# Patient Record
Sex: Female | Born: 1955 | Race: White | Hispanic: No | Smoking: Never smoker
Health system: Southern US, Community
[De-identification: ages and names within clinical notes are randomized; demographics above are authoritative.]

## PROBLEM LIST (undated history)

## (undated) DIAGNOSIS — I10 Essential (primary) hypertension: Secondary | ICD-10-CM

## (undated) DIAGNOSIS — E785 Hyperlipidemia, unspecified: Secondary | ICD-10-CM

## (undated) HISTORY — PX: CHOLECYSTECTOMY: SHX55

---

## 1997-06-25 ENCOUNTER — Ambulatory Visit (HOSPITAL_COMMUNITY): Admission: RE | Admit: 1997-06-25 | Discharge: 1997-06-25 | Payer: Self-pay | Admitting: *Deleted

## 1998-01-08 ENCOUNTER — Ambulatory Visit (HOSPITAL_COMMUNITY): Admission: RE | Admit: 1998-01-08 | Discharge: 1998-01-08 | Payer: Self-pay | Admitting: *Deleted

## 1998-01-27 ENCOUNTER — Ambulatory Visit (HOSPITAL_COMMUNITY): Admission: RE | Admit: 1998-01-27 | Discharge: 1998-01-27 | Payer: Self-pay | Admitting: *Deleted

## 1998-05-09 ENCOUNTER — Inpatient Hospital Stay (HOSPITAL_COMMUNITY): Admission: RE | Admit: 1998-05-09 | Discharge: 1998-05-10 | Payer: Self-pay | Admitting: Obstetrics and Gynecology

## 1999-02-27 ENCOUNTER — Encounter: Payer: Self-pay | Admitting: *Deleted

## 1999-02-27 ENCOUNTER — Ambulatory Visit (HOSPITAL_COMMUNITY): Admission: RE | Admit: 1999-02-27 | Discharge: 1999-02-27 | Payer: Self-pay | Admitting: *Deleted

## 2000-09-08 ENCOUNTER — Ambulatory Visit (HOSPITAL_COMMUNITY): Admission: RE | Admit: 2000-09-08 | Discharge: 2000-09-08 | Payer: Self-pay | Admitting: Obstetrics and Gynecology

## 2000-09-08 ENCOUNTER — Encounter: Payer: Self-pay | Admitting: Obstetrics and Gynecology

## 2001-09-12 ENCOUNTER — Encounter: Payer: Self-pay | Admitting: Obstetrics and Gynecology

## 2001-09-12 ENCOUNTER — Ambulatory Visit (HOSPITAL_COMMUNITY): Admission: RE | Admit: 2001-09-12 | Discharge: 2001-09-12 | Payer: Self-pay | Admitting: Obstetrics and Gynecology

## 2001-10-02 ENCOUNTER — Other Ambulatory Visit: Admission: RE | Admit: 2001-10-02 | Discharge: 2001-10-02 | Payer: Self-pay | Admitting: Obstetrics and Gynecology

## 2002-09-25 ENCOUNTER — Ambulatory Visit (HOSPITAL_COMMUNITY): Admission: RE | Admit: 2002-09-25 | Discharge: 2002-09-25 | Payer: Self-pay | Admitting: Obstetrics and Gynecology

## 2002-09-25 ENCOUNTER — Encounter: Payer: Self-pay | Admitting: Obstetrics and Gynecology

## 2002-10-30 ENCOUNTER — Other Ambulatory Visit: Admission: RE | Admit: 2002-10-30 | Discharge: 2002-10-30 | Payer: Self-pay | Admitting: Obstetrics and Gynecology

## 2003-11-12 ENCOUNTER — Ambulatory Visit (HOSPITAL_COMMUNITY): Admission: RE | Admit: 2003-11-12 | Discharge: 2003-11-12 | Payer: Self-pay | Admitting: Obstetrics and Gynecology

## 2003-11-12 ENCOUNTER — Other Ambulatory Visit: Admission: RE | Admit: 2003-11-12 | Discharge: 2003-11-12 | Payer: Self-pay | Admitting: Obstetrics and Gynecology

## 2004-11-17 ENCOUNTER — Ambulatory Visit (HOSPITAL_COMMUNITY): Admission: RE | Admit: 2004-11-17 | Discharge: 2004-11-17 | Payer: Self-pay | Admitting: Obstetrics and Gynecology

## 2005-12-28 ENCOUNTER — Ambulatory Visit (HOSPITAL_COMMUNITY): Admission: RE | Admit: 2005-12-28 | Discharge: 2005-12-28 | Payer: Self-pay | Admitting: Obstetrics and Gynecology

## 2007-01-10 ENCOUNTER — Ambulatory Visit (HOSPITAL_COMMUNITY): Admission: RE | Admit: 2007-01-10 | Discharge: 2007-01-10 | Payer: Self-pay | Admitting: Obstetrics and Gynecology

## 2008-01-18 ENCOUNTER — Ambulatory Visit (HOSPITAL_COMMUNITY): Admission: RE | Admit: 2008-01-18 | Discharge: 2008-01-18 | Payer: Self-pay | Admitting: Obstetrics and Gynecology

## 2009-02-25 ENCOUNTER — Ambulatory Visit (HOSPITAL_COMMUNITY): Admission: RE | Admit: 2009-02-25 | Discharge: 2009-02-25 | Payer: Self-pay | Admitting: Obstetrics and Gynecology

## 2010-03-05 ENCOUNTER — Ambulatory Visit (HOSPITAL_COMMUNITY): Admission: RE | Admit: 2010-03-05 | Discharge: 2010-03-05 | Payer: Self-pay | Admitting: Obstetrics and Gynecology

## 2011-02-10 ENCOUNTER — Other Ambulatory Visit (HOSPITAL_COMMUNITY): Payer: Self-pay | Admitting: Obstetrics and Gynecology

## 2011-02-10 DIAGNOSIS — Z1231 Encounter for screening mammogram for malignant neoplasm of breast: Secondary | ICD-10-CM

## 2011-03-09 ENCOUNTER — Ambulatory Visit (HOSPITAL_COMMUNITY)
Admission: RE | Admit: 2011-03-09 | Discharge: 2011-03-09 | Disposition: A | Discharge status: Home / Self Care | Payer: Managed Care, Other (non HMO) | Source: Ambulatory Visit | Attending: Obstetrics and Gynecology | Admitting: Obstetrics and Gynecology

## 2011-03-09 DIAGNOSIS — Z1231 Encounter for screening mammogram for malignant neoplasm of breast: Secondary | ICD-10-CM | POA: Insufficient documentation

## 2012-03-28 ENCOUNTER — Other Ambulatory Visit (HOSPITAL_COMMUNITY): Payer: Self-pay | Admitting: Obstetrics and Gynecology

## 2012-03-28 DIAGNOSIS — Z1231 Encounter for screening mammogram for malignant neoplasm of breast: Secondary | ICD-10-CM

## 2012-04-13 ENCOUNTER — Ambulatory Visit (HOSPITAL_COMMUNITY)
Admission: RE | Admit: 2012-04-13 | Discharge: 2012-04-13 | Disposition: A | Discharge status: Home / Self Care | Payer: Managed Care, Other (non HMO) | Source: Ambulatory Visit | Attending: Obstetrics and Gynecology | Admitting: Obstetrics and Gynecology

## 2012-04-13 DIAGNOSIS — Z1231 Encounter for screening mammogram for malignant neoplasm of breast: Secondary | ICD-10-CM | POA: Insufficient documentation

## 2013-04-05 ENCOUNTER — Other Ambulatory Visit (HOSPITAL_COMMUNITY): Payer: Self-pay | Admitting: Obstetrics and Gynecology

## 2013-04-05 DIAGNOSIS — Z1231 Encounter for screening mammogram for malignant neoplasm of breast: Secondary | ICD-10-CM

## 2013-04-17 ENCOUNTER — Ambulatory Visit (HOSPITAL_COMMUNITY): Payer: Managed Care, Other (non HMO)

## 2013-04-26 ENCOUNTER — Ambulatory Visit (HOSPITAL_COMMUNITY)
Admission: RE | Admit: 2013-04-26 | Discharge: 2013-04-26 | Disposition: A | Discharge status: Home / Self Care | Payer: Managed Care, Other (non HMO) | Source: Ambulatory Visit | Attending: Obstetrics and Gynecology | Admitting: Obstetrics and Gynecology

## 2013-04-26 DIAGNOSIS — Z1231 Encounter for screening mammogram for malignant neoplasm of breast: Secondary | ICD-10-CM | POA: Insufficient documentation

## 2014-04-22 ENCOUNTER — Other Ambulatory Visit (HOSPITAL_COMMUNITY): Payer: Self-pay | Admitting: Obstetrics and Gynecology

## 2014-04-22 DIAGNOSIS — Z1231 Encounter for screening mammogram for malignant neoplasm of breast: Secondary | ICD-10-CM

## 2014-05-06 ENCOUNTER — Ambulatory Visit (HOSPITAL_COMMUNITY)
Admission: RE | Admit: 2014-05-06 | Discharge: 2014-05-06 | Disposition: A | Discharge status: Home / Self Care | Payer: Managed Care, Other (non HMO) | Source: Ambulatory Visit | Attending: Obstetrics and Gynecology | Admitting: Obstetrics and Gynecology

## 2014-05-06 DIAGNOSIS — Z1231 Encounter for screening mammogram for malignant neoplasm of breast: Secondary | ICD-10-CM

## 2015-05-29 ENCOUNTER — Other Ambulatory Visit: Payer: Self-pay

## 2015-05-29 DIAGNOSIS — Z1231 Encounter for screening mammogram for malignant neoplasm of breast: Secondary | ICD-10-CM

## 2015-06-09 ENCOUNTER — Ambulatory Visit
Admission: RE | Admit: 2015-06-09 | Discharge: 2015-06-09 | Disposition: A | Discharge status: Home / Self Care | Payer: Managed Care, Other (non HMO) | Source: Ambulatory Visit

## 2015-06-09 DIAGNOSIS — Z1231 Encounter for screening mammogram for malignant neoplasm of breast: Secondary | ICD-10-CM

## 2016-07-26 ENCOUNTER — Other Ambulatory Visit: Payer: Self-pay | Admitting: Family Medicine

## 2016-07-26 DIAGNOSIS — Z1231 Encounter for screening mammogram for malignant neoplasm of breast: Secondary | ICD-10-CM

## 2016-08-23 ENCOUNTER — Ambulatory Visit
Admission: RE | Admit: 2016-08-23 | Discharge: 2016-08-23 | Disposition: A | Discharge status: Home / Self Care | Payer: 59 | Source: Ambulatory Visit | Attending: Family Medicine | Admitting: Family Medicine

## 2016-08-23 DIAGNOSIS — Z1231 Encounter for screening mammogram for malignant neoplasm of breast: Secondary | ICD-10-CM

## 2017-10-10 ENCOUNTER — Other Ambulatory Visit: Payer: Self-pay | Admitting: Family Medicine

## 2017-10-10 DIAGNOSIS — Z1231 Encounter for screening mammogram for malignant neoplasm of breast: Secondary | ICD-10-CM

## 2017-11-02 ENCOUNTER — Ambulatory Visit
Admission: RE | Admit: 2017-11-02 | Discharge: 2017-11-02 | Disposition: A | Discharge status: Home / Self Care | Payer: BLUE CROSS/BLUE SHIELD | Source: Ambulatory Visit | Attending: Family Medicine | Admitting: Family Medicine

## 2017-11-02 DIAGNOSIS — Z1231 Encounter for screening mammogram for malignant neoplasm of breast: Secondary | ICD-10-CM

## 2020-01-29 ENCOUNTER — Other Ambulatory Visit: Payer: Self-pay | Admitting: Family Medicine

## 2020-01-29 DIAGNOSIS — Z1231 Encounter for screening mammogram for malignant neoplasm of breast: Secondary | ICD-10-CM

## 2020-02-21 ENCOUNTER — Other Ambulatory Visit: Payer: Self-pay

## 2020-02-21 ENCOUNTER — Ambulatory Visit
Admission: RE | Admit: 2020-02-21 | Discharge: 2020-02-21 | Disposition: A | Discharge status: Home / Self Care | Payer: BLUE CROSS/BLUE SHIELD | Source: Ambulatory Visit | Attending: Family Medicine | Admitting: Family Medicine

## 2020-02-21 DIAGNOSIS — Z1231 Encounter for screening mammogram for malignant neoplasm of breast: Secondary | ICD-10-CM

## 2020-07-18 DIAGNOSIS — H2513 Age-related nuclear cataract, bilateral: Secondary | ICD-10-CM | POA: Diagnosis not present

## 2020-08-14 DIAGNOSIS — E559 Vitamin D deficiency, unspecified: Secondary | ICD-10-CM | POA: Diagnosis not present

## 2020-08-14 DIAGNOSIS — M810 Age-related osteoporosis without current pathological fracture: Secondary | ICD-10-CM | POA: Diagnosis not present

## 2020-08-14 DIAGNOSIS — E782 Mixed hyperlipidemia: Secondary | ICD-10-CM | POA: Diagnosis not present

## 2020-08-14 DIAGNOSIS — I10 Essential (primary) hypertension: Secondary | ICD-10-CM | POA: Diagnosis not present

## 2020-08-14 DIAGNOSIS — G4700 Insomnia, unspecified: Secondary | ICD-10-CM | POA: Diagnosis not present

## 2021-02-17 DIAGNOSIS — Z8616 Personal history of COVID-19: Secondary | ICD-10-CM | POA: Diagnosis not present

## 2021-02-17 DIAGNOSIS — Z1211 Encounter for screening for malignant neoplasm of colon: Secondary | ICD-10-CM | POA: Diagnosis not present

## 2021-02-17 DIAGNOSIS — Z0000 Encounter for general adult medical examination without abnormal findings: Secondary | ICD-10-CM | POA: Diagnosis not present

## 2021-02-17 DIAGNOSIS — Z23 Encounter for immunization: Secondary | ICD-10-CM | POA: Diagnosis not present

## 2021-02-17 DIAGNOSIS — Z1389 Encounter for screening for other disorder: Secondary | ICD-10-CM | POA: Diagnosis not present

## 2021-02-17 DIAGNOSIS — G4700 Insomnia, unspecified: Secondary | ICD-10-CM | POA: Diagnosis not present

## 2021-02-17 DIAGNOSIS — I10 Essential (primary) hypertension: Secondary | ICD-10-CM | POA: Diagnosis not present

## 2021-02-17 DIAGNOSIS — M810 Age-related osteoporosis without current pathological fracture: Secondary | ICD-10-CM | POA: Diagnosis not present

## 2021-02-17 DIAGNOSIS — E782 Mixed hyperlipidemia: Secondary | ICD-10-CM | POA: Diagnosis not present

## 2021-02-17 DIAGNOSIS — E559 Vitamin D deficiency, unspecified: Secondary | ICD-10-CM | POA: Diagnosis not present

## 2021-08-04 ENCOUNTER — Other Ambulatory Visit: Payer: Self-pay | Admitting: Family Medicine

## 2021-08-04 DIAGNOSIS — Z1231 Encounter for screening mammogram for malignant neoplasm of breast: Secondary | ICD-10-CM

## 2021-08-17 DIAGNOSIS — R5383 Other fatigue: Secondary | ICD-10-CM | POA: Diagnosis not present

## 2021-08-17 DIAGNOSIS — E782 Mixed hyperlipidemia: Secondary | ICD-10-CM | POA: Diagnosis not present

## 2021-08-17 DIAGNOSIS — I10 Essential (primary) hypertension: Secondary | ICD-10-CM | POA: Diagnosis not present

## 2021-08-17 DIAGNOSIS — G4700 Insomnia, unspecified: Secondary | ICD-10-CM | POA: Diagnosis not present

## 2021-08-17 DIAGNOSIS — R0683 Snoring: Secondary | ICD-10-CM | POA: Diagnosis not present

## 2021-08-17 DIAGNOSIS — M810 Age-related osteoporosis without current pathological fracture: Secondary | ICD-10-CM | POA: Diagnosis not present

## 2021-08-17 DIAGNOSIS — E559 Vitamin D deficiency, unspecified: Secondary | ICD-10-CM | POA: Diagnosis not present

## 2021-08-17 DIAGNOSIS — Z87898 Personal history of other specified conditions: Secondary | ICD-10-CM | POA: Diagnosis not present

## 2021-08-18 ENCOUNTER — Ambulatory Visit
Admission: RE | Admit: 2021-08-18 | Discharge: 2021-08-18 | Disposition: A | Discharge status: Home / Self Care | Payer: Medicare Other | Source: Ambulatory Visit | Attending: Family Medicine | Admitting: Family Medicine

## 2021-08-18 DIAGNOSIS — Z1231 Encounter for screening mammogram for malignant neoplasm of breast: Secondary | ICD-10-CM | POA: Diagnosis not present

## 2021-10-13 ENCOUNTER — Other Ambulatory Visit: Payer: Self-pay

## 2021-10-13 ENCOUNTER — Encounter (HOSPITAL_COMMUNITY): Payer: Self-pay

## 2021-10-13 ENCOUNTER — Emergency Department (HOSPITAL_COMMUNITY): Payer: Medicare Other

## 2021-10-13 ENCOUNTER — Observation Stay (HOSPITAL_COMMUNITY)
Admission: EM | Admit: 2021-10-13 | Discharge: 2021-10-15 | Disposition: A | Discharge status: Home / Self Care | Payer: Medicare Other | Attending: Cardiology | Admitting: Cardiology

## 2021-10-13 DIAGNOSIS — R001 Bradycardia, unspecified: Secondary | ICD-10-CM

## 2021-10-13 DIAGNOSIS — I10 Essential (primary) hypertension: Secondary | ICD-10-CM | POA: Diagnosis not present

## 2021-10-13 DIAGNOSIS — Z79899 Other long term (current) drug therapy: Secondary | ICD-10-CM | POA: Insufficient documentation

## 2021-10-13 DIAGNOSIS — I441 Atrioventricular block, second degree: Principal | ICD-10-CM | POA: Insufficient documentation

## 2021-10-13 HISTORY — DX: Essential (primary) hypertension: I10

## 2021-10-13 HISTORY — DX: Hyperlipidemia, unspecified: E78.5

## 2021-10-13 LAB — TROPONIN I (HIGH SENSITIVITY)
Troponin I (High Sensitivity): 5 ng/L (ref <18)
Troponin I (High Sensitivity): 8 ng/L (ref <18)

## 2021-10-13 LAB — COMPREHENSIVE METABOLIC PANEL
ALT: 27 U/L (ref 0–44)
AST: 23 U/L (ref 15–41)
Albumin: 4.6 g/dL (ref 3.5–5.0)
Alkaline Phosphatase: 66 U/L (ref 38–126)
Anion gap: 10 (ref 5–15)
BUN: 19 mg/dL (ref 8–23)
CO2: 18 mmol/L — ABNORMAL LOW (ref 22–32)
Calcium: 9.9 mg/dL (ref 8.9–10.3)
Chloride: 108 mmol/L (ref 98–111)
Creatinine, Ser: 1.22 mg/dL — ABNORMAL HIGH (ref 0.44–1.00)
GFR, Estimated: 49 mL/min — ABNORMAL LOW (ref >60)
Glucose, Bld: 141 mg/dL — ABNORMAL HIGH (ref 70–99)
Potassium: 4.3 mmol/L (ref 3.5–5.1)
Sodium: 136 mmol/L (ref 135–145)
Total Bilirubin: 0.4 mg/dL (ref 0.3–1.2)
Total Protein: 7.2 g/dL (ref 6.5–8.1)

## 2021-10-13 LAB — CBC
HCT: 38.6 % (ref 36.0–46.0)
Hemoglobin: 12.6 g/dL (ref 12.0–15.0)
MCH: 29.3 pg (ref 26.0–34.0)
MCHC: 32.6 g/dL (ref 30.0–36.0)
MCV: 89.8 fL (ref 80.0–100.0)
Platelets: 295 10*3/uL (ref 150–400)
RBC: 4.3 MIL/uL (ref 3.87–5.11)
RDW: 12.8 % (ref 11.5–15.5)
WBC: 9.7 10*3/uL (ref 4.0–10.5)
nRBC: 0 % (ref 0.0–0.2)

## 2021-10-13 LAB — TSH: TSH: 2.762 u[IU]/mL (ref 0.350–4.500)

## 2021-10-13 LAB — GLUCOSE, CAPILLARY: Glucose-Capillary: 107 mg/dL — ABNORMAL HIGH (ref 70–99)

## 2021-10-13 LAB — T4, FREE: Free T4: 0.8 ng/dL (ref 0.61–1.12)

## 2021-10-13 MED ORDER — ONDANSETRON HCL 4 MG/2ML IJ SOLN: 4.0000 mg | Freq: Four times a day (QID) | INTRAMUSCULAR | Status: DC | PRN

## 2021-10-13 MED ORDER — ZOLPIDEM TARTRATE 5 MG PO TABS
5.0000 mg | ORAL_TABLET | Freq: Every evening | ORAL | Status: DC | PRN
  Administered 2021-10-13 – 2021-10-14 (×2): 5 mg via ORAL
  Filled 2021-10-13 (×2): qty 1

## 2021-10-13 MED ORDER — SODIUM CHLORIDE 0.9 % IV SOLN: 250.0000 mL | INTRAVENOUS | Status: DC | PRN

## 2021-10-13 MED ORDER — ORAL CARE MOUTH RINSE: 15.0000 mL | OROMUCOSAL | Status: DC | PRN

## 2021-10-13 MED ORDER — IRBESARTAN 300 MG PO TABS
300.0000 mg | ORAL_TABLET | Freq: Every day | ORAL | Status: DC
  Administered 2021-10-13 – 2021-10-15 (×3): 300 mg via ORAL
  Filled 2021-10-13 (×4): qty 1

## 2021-10-13 MED ORDER — ACETAMINOPHEN 325 MG PO TABS
650.0000 mg | ORAL_TABLET | ORAL | Status: DC | PRN
  Filled 2021-10-13: qty 2

## 2021-10-13 MED ORDER — ALPRAZOLAM 0.25 MG PO TABS
0.2500 mg | ORAL_TABLET | Freq: Two times a day (BID) | ORAL | Status: DC | PRN
  Administered 2021-10-14 (×2): 0.25 mg via ORAL
  Filled 2021-10-13 (×2): qty 1

## 2021-10-13 MED ORDER — SODIUM CHLORIDE 0.9% FLUSH
3.0000 mL | Freq: Two times a day (BID) | INTRAVENOUS | Status: DC
  Administered 2021-10-13 – 2021-10-15 (×4): 3 mL via INTRAVENOUS

## 2021-10-13 MED ORDER — ATORVASTATIN CALCIUM 10 MG PO TABS
10.0000 mg | ORAL_TABLET | Freq: Every day | ORAL | Status: DC
  Administered 2021-10-13 – 2021-10-15 (×3): 10 mg via ORAL
  Filled 2021-10-13 (×3): qty 1

## 2021-10-13 MED ORDER — ESCITALOPRAM OXALATE 10 MG PO TABS
10.0000 mg | ORAL_TABLET | Freq: Every day | ORAL | Status: DC
  Administered 2021-10-13 – 2021-10-15 (×3): 10 mg via ORAL
  Filled 2021-10-13 (×3): qty 1

## 2021-10-13 MED ORDER — CHLORHEXIDINE GLUCONATE CLOTH 2 % EX PADS
6.0000 | MEDICATED_PAD | Freq: Every day | CUTANEOUS | Status: DC
  Administered 2021-10-13 – 2021-10-14 (×2): 6 via TOPICAL

## 2021-10-13 MED ORDER — SODIUM CHLORIDE 0.9% FLUSH: 3.0000 mL | INTRAVENOUS | Status: DC | PRN

## 2021-10-13 MED ORDER — NITROGLYCERIN 0.4 MG SL SUBL: 0.4000 mg | SUBLINGUAL_TABLET | SUBLINGUAL | Status: DC | PRN

## 2021-10-13 MED ORDER — ENOXAPARIN SODIUM 40 MG/0.4ML IJ SOSY
40.0000 mg | PREFILLED_SYRINGE | INTRAMUSCULAR | Status: DC
  Administered 2021-10-13: 40 mg via SUBCUTANEOUS
  Filled 2021-10-13 (×2): qty 0.4

## 2021-10-13 NOTE — ED Notes (Signed)
Zol Pads placed on pt per Lawsing MD.

## 2021-10-14 ENCOUNTER — Observation Stay (HOSPITAL_BASED_OUTPATIENT_CLINIC_OR_DEPARTMENT_OTHER): Payer: Medicare Other

## 2021-10-14 ENCOUNTER — Ambulatory Visit (HOSPITAL_COMMUNITY)
Admission: EM | Disposition: A | Discharge status: Home / Self Care | Payer: Self-pay | Source: Home / Self Care | Attending: Emergency Medicine

## 2021-10-14 DIAGNOSIS — R9431 Abnormal electrocardiogram [ECG] [EKG]: Secondary | ICD-10-CM

## 2021-10-14 DIAGNOSIS — Z79899 Other long term (current) drug therapy: Secondary | ICD-10-CM | POA: Diagnosis not present

## 2021-10-14 DIAGNOSIS — I10 Essential (primary) hypertension: Secondary | ICD-10-CM | POA: Diagnosis not present

## 2021-10-14 DIAGNOSIS — I441 Atrioventricular block, second degree: Secondary | ICD-10-CM | POA: Diagnosis not present

## 2021-10-14 HISTORY — PX: PACEMAKER IMPLANT: EP1218

## 2021-10-14 LAB — ECHOCARDIOGRAM COMPLETE
Area-P 1/2: 5.97 cm2
Calc EF: 82 %
Height: 60 in
S' Lateral: 3.1 cm
Single Plane A2C EF: 83.5 %
Single Plane A4C EF: 80.9 %
Weight: 2405.66 oz

## 2021-10-14 LAB — BASIC METABOLIC PANEL
Anion gap: 10 (ref 5–15)
BUN: 15 mg/dL (ref 8–23)
CO2: 22 mmol/L (ref 22–32)
Calcium: 9.7 mg/dL (ref 8.9–10.3)
Chloride: 108 mmol/L (ref 98–111)
Creatinine, Ser: 0.98 mg/dL (ref 0.44–1.00)
GFR, Estimated: >60 mL/min (ref >60)
Glucose, Bld: 104 mg/dL — ABNORMAL HIGH (ref 70–99)
Potassium: 4.4 mmol/L (ref 3.5–5.1)
Sodium: 140 mmol/L (ref 135–145)

## 2021-10-14 LAB — CBC
HCT: 36.1 % (ref 36.0–46.0)
Hemoglobin: 12.2 g/dL (ref 12.0–15.0)
MCH: 29.3 pg (ref 26.0–34.0)
MCHC: 33.8 g/dL (ref 30.0–36.0)
MCV: 86.8 fL (ref 80.0–100.0)
Platelets: 251 10*3/uL (ref 150–400)
RBC: 4.16 MIL/uL (ref 3.87–5.11)
RDW: 12.6 % (ref 11.5–15.5)
WBC: 7.5 10*3/uL (ref 4.0–10.5)
nRBC: 0 % (ref 0.0–0.2)

## 2021-10-14 LAB — MRSA NEXT GEN BY PCR, NASAL: MRSA by PCR Next Gen: NOT DETECTED

## 2021-10-14 LAB — SURGICAL PCR SCREEN
MRSA, PCR: NEGATIVE
Staphylococcus aureus: NEGATIVE

## 2021-10-14 LAB — HIV ANTIBODY (ROUTINE TESTING W REFLEX): HIV Screen 4th Generation wRfx: NONREACTIVE

## 2021-10-14 SURGERY — PACEMAKER IMPLANT

## 2021-10-14 MED ORDER — LIDOCAINE HCL (PF) 1 % IJ SOLN
INTRAMUSCULAR | Status: AC
  Filled 2021-10-14: qty 90

## 2021-10-14 MED ORDER — HEPARIN (PORCINE) IN NACL 1000-0.9 UT/500ML-% IV SOLN
INTRAVENOUS | Status: DC | PRN
  Administered 2021-10-14: 500 mL

## 2021-10-14 MED ORDER — SODIUM CHLORIDE 0.9 % IV SOLN: INTRAVENOUS | Status: DC

## 2021-10-14 MED ORDER — LIDOCAINE HCL (PF) 1 % IJ SOLN
INTRAMUSCULAR | Status: DC | PRN
  Administered 2021-10-14: 50 mL

## 2021-10-14 MED ORDER — ONDANSETRON HCL 4 MG/2ML IJ SOLN: 4.0000 mg | Freq: Four times a day (QID) | INTRAMUSCULAR | Status: DC | PRN

## 2021-10-14 MED ORDER — LOPERAMIDE HCL 2 MG PO CAPS
2.0000 mg | ORAL_CAPSULE | ORAL | Status: DC | PRN
  Administered 2021-10-14: 4 mg via ORAL
  Administered 2021-10-14: 2 mg via ORAL
  Filled 2021-10-14: qty 1
  Filled 2021-10-14: qty 2

## 2021-10-14 MED ORDER — ACETAMINOPHEN 325 MG PO TABS
325.0000 mg | ORAL_TABLET | ORAL | Status: DC | PRN
  Administered 2021-10-14 – 2021-10-15 (×2): 650 mg via ORAL
  Filled 2021-10-14: qty 2

## 2021-10-14 MED ORDER — CHLORHEXIDINE GLUCONATE 4 % EX LIQD: 60.0000 mL | Freq: Once | CUTANEOUS | Status: AC

## 2021-10-14 MED ORDER — HEPARIN (PORCINE) IN NACL 1000-0.9 UT/500ML-% IV SOLN
INTRAVENOUS | Status: AC
  Filled 2021-10-14: qty 500

## 2021-10-14 MED ORDER — CEFAZOLIN SODIUM-DEXTROSE 2-4 GM/100ML-% IV SOLN
2.0000 g | INTRAVENOUS | Status: AC
  Administered 2021-10-14: 2 g via INTRAVENOUS

## 2021-10-14 MED ORDER — SODIUM CHLORIDE 0.9 % IV SOLN
INTRAVENOUS | Status: AC
  Filled 2021-10-14: qty 2

## 2021-10-14 MED ORDER — CEFAZOLIN SODIUM-DEXTROSE 1-4 GM/50ML-% IV SOLN
1.0000 g | Freq: Four times a day (QID) | INTRAVENOUS | Status: DC
  Administered 2021-10-14 – 2021-10-15 (×2): 1 g via INTRAVENOUS
  Filled 2021-10-14 (×5): qty 50

## 2021-10-14 MED ORDER — CHLORHEXIDINE GLUCONATE 4 % EX LIQD
60.0000 mL | Freq: Once | CUTANEOUS | Status: AC
  Administered 2021-10-14: 4 via TOPICAL
  Filled 2021-10-14: qty 60

## 2021-10-14 MED ORDER — MIDAZOLAM HCL 5 MG/5ML IJ SOLN
INTRAMUSCULAR | Status: DC | PRN
  Administered 2021-10-14: 1 mg via INTRAVENOUS

## 2021-10-14 MED ORDER — SODIUM CHLORIDE 0.9 % IV SOLN
80.0000 mg | INTRAVENOUS | Status: AC
  Administered 2021-10-14: 80 mg
  Filled 2021-10-14: qty 2

## 2021-10-14 MED ORDER — CEFAZOLIN SODIUM-DEXTROSE 2-4 GM/100ML-% IV SOLN
INTRAVENOUS | Status: AC
  Filled 2021-10-14: qty 100

## 2021-10-14 MED ORDER — MIDAZOLAM HCL 5 MG/5ML IJ SOLN
INTRAMUSCULAR | Status: AC
  Filled 2021-10-14: qty 5

## 2021-10-14 SURGICAL SUPPLY — 13 items
CABLE SURGICAL S-101-97-12 (CABLE) ×2 IMPLANT
KIT ACCESSORY SELECTRA FIX CVD (MISCELLANEOUS) ×1 IMPLANT
LEAD SELECTRA 3D-55-42 (CATHETERS) ×1 IMPLANT
LEAD SOLIA S PRO MRI 53 (Lead) ×1 IMPLANT
LEAD SOLIA S PRO MRI 60 (Lead) ×1 IMPLANT
PACEMAKER EDORA 8DR-T MRI (Pacemaker) ×1 IMPLANT
PAD DEFIB RADIO PHYSIO CONN (PAD) ×2 IMPLANT
SHEATH 7FR PRELUDE SNAP 13 (SHEATH) ×1 IMPLANT
SHEATH 9FR PRELUDE SNAP 13 (SHEATH) ×1 IMPLANT
SHEATH PROBE COVER 6X72 (BAG) ×1 IMPLANT
SLITTER AGILIS HISPRO (INSTRUMENTS) ×1 IMPLANT
TRAY PACEMAKER INSERTION (PACKS) ×2 IMPLANT
WIRE HI TORQ VERSACORE-J 145CM (WIRE) ×1 IMPLANT

## 2021-10-14 NOTE — Interval H&P Note (Signed)
History and Physical Interval Note:  10/14/2021 1:02 PM  Pamela Ponce  has presented today for surgery, with the diagnosis of av block.  The various methods of treatment have been discussed with the patient and family. After consideration of risks, benefits and other options for treatment, the patient has consented to  Procedure(s): PACEMAKER IMPLANT (N/A) as a surgical intervention.  The patient's history has been reviewed, patient examined, no change in status, stable for surgery.  I have reviewed the patient's chart and labs.  Questions were answered to the patient's satisfaction.     Tekela Garguilo [employer]

## 2021-10-14 NOTE — Care Management Obs Status (Signed)
MEDICARE OBSERVATION STATUS NOTIFICATION   Patient Details  Name: Pamela Ponce MRN: [id number] Date of Birth: 1955/07/07   Medicare Observation Status Notification Given:  Yes    Harriet Masson, RN 10/14/2021, 12:51 PM

## 2021-10-14 NOTE — Care Management CC44 (Signed)
Condition Code 44 Documentation Completed  Patient Details  Name: Pamela Ponce MRN: [id number] Date of Birth: [birth year]   Condition Code 44 given:  Yes Patient signature on Condition Code 44 notice:  Yes Documentation of 2 MD's agreement:  Yes Code 44 added to claim:  Yes    Harriet Masson, RN 10/14/2021, 12:51 PM

## 2021-10-14 NOTE — H&P (View-Only) (Signed)
ELECTROPHYSIOLOGY CONSULT NOTE    Patient ID: Pamela Ponce MRN: [id number], DOB/AGE: [birth year] 66 y.o.  Admit date: 10/13/2021 Date of Consult: 10/14/2021  Primary Physician: Pamela Joe, MD Primary Cardiologist: Pamela Schultz, MD  Electrophysiologist: New  Referring Provider: Dr. Anne Ponce  Patient Profile: Pamela Ponce is a 66 y.o. female with a history of HTN and HLD who is being seen today for the evaluation of symptomatic bradycardia and advanced AV block at the request of Dr. Anne Ponce.  HPI:  Pamela Ponce is a 66 y.o. female with medical history as above.   Pt seen at [facility] in yesterday per PCP recommendation with fatigue, DOE, and bradycardia at home.  Noted to have low HR and concerns for heart block on EKG. Sent to ED.   Pertinent labs include Hgb 12.6, WBC 9.7, HS trop negative x 2, Cr 1.22, K 4.3, TSH 2.762  Pt is not on AV nodal agents.  She denies chest pain, prior MI, or any known tick bites.  She has not had any LE edema, orthopnea, or PND. She has had worsening dyspnea on exertion "much worse" than when her HR was "normal".  She has also had presyncope with exertion.   Echo is scheduled for this am.   Past Medical History:  Diagnosis Date   Hyperlipidemia    Hypertension      Surgical History:  Past Surgical History:  Procedure Laterality Date   CESAREAN SECTION     x 2   CHOLECYSTECTOMY       Medications Prior to Admission  Medication Sig Dispense Refill Last Dose   acetaminophen (TYLENOL) 500 MG tablet Take 500 mg by mouth every 6 (six) hours as needed (for headaches).   Past Week   atorvastatin (LIPITOR) 10 MG tablet Take 10 mg by mouth daily.   10/12/2021 at 1400   ergocalciferol (VITAMIN D2) 1.25 MG (50000 UT) capsule Take 50,000 Units by mouth every Sunday.   Past Week   escitalopram (LEXAPRO) 10 MG tablet Take 10 mg by mouth daily.   10/12/2021   olmesartan (BENICAR) 40 MG tablet Take 40 mg by mouth daily.   10/12/2021 at 1400   zolpidem  (AMBIEN) 5 MG tablet Take 5 mg by mouth at bedtime.   10/12/2021 at pm    Inpatient Medications:   atorvastatin  10 mg Oral Daily   Chlorhexidine Gluconate Cloth  6 each Topical Daily   enoxaparin (LOVENOX) injection  40 mg Subcutaneous Q24H   escitalopram  10 mg Oral Daily   irbesartan  300 mg Oral Daily   sodium chloride flush  3 mL Intravenous Q12H    Allergies:  Allergies  Allergen Reactions   Lactose Intolerance (Gi) Diarrhea and Other (See Comments)    Scratchy throat, also   Morphine Sulfate Itching   Other Nausea Only and Other (See Comments)    Fried or spicy foods upset the stomach   Tape Other (See Comments)    Band-Aids irritate the skin   Tilactase Diarrhea    Social History   Socioeconomic History   Marital status: Divorced    Spouse name: Not on file   Number of children: Not on file   Years of education: Not on file   Highest education level: Not on file  Occupational History   Not on file  Tobacco Use   Smoking status: Never   Smokeless tobacco: Never  Vaping Use   Vaping Use: Never used  Substance and Sexual Activity  Alcohol use: Never   Drug use: Never   Sexual activity: Not on file  Other Topics Concern   Not on file  Social History Narrative   Not on file   Social Determinants of Health   Financial Resource Strain: Not on file  Food Insecurity: Not on file  Transportation Needs: Not on file  Physical Activity: Not on file  Stress: Not on file  Social Connections: Not on file  Intimate Partner Violence: Not on file     Family History  Problem Relation Age of Onset   Diabetes Mother    Pneumonia Father    Breast cancer Sister        43s     Review of Systems: All other systems reviewed and are otherwise negative except as noted above.  Physical Exam: Vitals:   10/14/21 0600 10/14/21 0630 10/14/21 0700 10/14/21 0730  BP: (!) 109/42 (!) 108/44 (!) 140/57 (!) 144/54  Pulse: (!) 34 (!) 32 (!) 34 (!) 34  Resp: 15 17 10 13    Temp:      TempSrc:      SpO2: 93% 94% 99% 95%  Weight: 68.2 kg     Height: 5' (1.524 m)       GEN- The patient is well appearing, alert and oriented x 3 today.   HEENT: normocephalic, atraumatic; sclera clear, conjunctiva pink; hearing intact; oropharynx clear; neck supple Lungs- Clear to ausculation bilaterally, normal work of breathing.  No wheezes, rales, rhonchi Heart- Regular rate and rhythm, no murmurs, rubs or gallops GI- soft, non-tender, non-distended, bowel sounds present Extremities- no clubbing, cyanosis, or edema; DP/PT/radial pulses 2+ bilaterally MS- no significant deformity or atrophy Skin- warm and dry, no rash or lesion Psych- euthymic mood, full affect Neuro- strength and sensation are intact  Labs:   Lab Results  Component Value Date   WBC 7.5 10/14/2021   HGB 12.2 10/14/2021   HCT 36.1 10/14/2021   MCV 86.8 10/14/2021   PLT 251 10/14/2021    Recent Labs  Lab 10/13/21 1722 10/14/21 0617  NA 136 140  K 4.3 4.4  CL 108 108  CO2 18* 22  BUN 19 15  CREATININE 1.22* 0.98  CALCIUM 9.9 9.7  PROT 7.2  --   BILITOT 0.4  --   ALKPHOS 66  --   ALT 27  --   AST 23  --   GLUCOSE 141* 104*      Radiology/Studies: DG Chest Portable 1 View  Result Date: 10/13/2021 CLINICAL DATA:  Bradycardia EXAM: PORTABLE CHEST 1 VIEW COMPARISON:  None Available. FINDINGS: The heart size and mediastinal contours are within normal limits. Both lungs are clear. The visualized skeletal structures are unremarkable. IMPRESSION: No active disease. Electronically Signed   By: 10/15/2021 M.D.   On: 10/13/2021 18:09    EKG: on arrival appears to show 2 vs 3 to 1 AV block (personally reviewed)  TELEMETRY: Advanced AV block with HRs as low as 34 in 2:1 AV block (personally reviewed)  Assessment/Plan: 1.  Advanced AV block No reversible causes thus far Leave NPO for now. Begin hydration.  Echo pending, if normal, Pamela Ponce need pacing, if EF down, Pamela Ponce likely need cath first.   Explained risks, benefits, and alternatives to PPM implantation, including but not limited to bleeding, infection, pneumothorax, pericardial effusion, lead dislodgement, heart attack, stroke, or death.  Pt verbalized understanding and agrees to proceed if indicated.   2. HTN Abdulkadir Emmanuel be able to adjust meds more freely  post pacing/intervention.   Dr. Elberta Fortis to see.  For questions or updates, please contact [facility] Please consult [email or web address] for contact info under Cardiology/STEMI.  Dustin Flock, PA-C  10/14/2021 7:39 AM   I have seen and examined this patient with Otilio Saber.  Agree with above, note added to reflect my findings.  Patient presented to her primary physician with symptoms of fatigue and weakness.  She was noted to be bradycardic.  Referred to the emergency room was found to be in 2-1 AV block.  On no rate controlling medications.  Comfortable in bed.  GEN: Well nourished, well developed, in no acute distress  HEENT: normal  Neck: no JVD, carotid bruits, or masses Cardiac: Bradycardic; no murmurs, rubs, or gallops,no edema  Respiratory:  clear to auscultation bilaterally, normal work of breathing GI: soft, nontender, nondistended, + BS MS: no deformity or atrophy  Skin: warm and dry Neuro:  Strength and sensation are intact Psych: euthymic mood, full affect   2-1 second-degree AV block: No reversible causes.  Patient has symptoms of fatigue and weakness.  We Kendryck Lacroix plan for pacemaker implant.  Risk and benefits of been discussed which include bleeding, tamponade, infection, pneumothorax, lead dislodgment.  She understands these risks and is agreed to the procedure.  Ruthell Feigenbaum M. Shaunika Italiano MD 10/14/2021 1:00 PM

## 2021-10-14 NOTE — Consult Note (Addendum)
 ELECTROPHYSIOLOGY CONSULT NOTE    Patient ID: Pamela Ponce MRN: [id number], DOB/AGE: 04/23/1955 65 y.o.  Admit date: 10/13/2021 Date of Consult: 10/14/2021  Primary Physician: Swayne, David, MD Primary Cardiologist: Pamela Skains, MD  Electrophysiologist: Pamela Ponce  Referring Provider: Dr. Skains  Patient Profile: Pamela Ponce is a 65 y.o. female with a history of HTN and HLD who is being seen today for the evaluation of symptomatic bradycardia and advanced AV block at the request of Dr. Skains.  HPI:  Pamela Ponce is a 65 y.o. female with medical history as above.   Pt seen at [facility] in yesterday per PCP recommendation with fatigue, DOE, and bradycardia at home.  Noted to have low HR and concerns for heart block on EKG. Sent to ED.   Pertinent labs include Hgb 12.6, WBC 9.7, HS trop negative x 2, Cr 1.22, K 4.3, TSH 2.762  Pt is not on AV nodal agents.  She denies chest pain, prior MI, or any known tick bites.  She has not had any LE edema, orthopnea, or PND. She has had worsening dyspnea on exertion "much worse" than when her HR was "normal".  She has also had presyncope with exertion.   Echo is scheduled for this am.   Past Medical History:  Diagnosis Date   Hyperlipidemia    Hypertension      Surgical History:  Past Surgical History:  Procedure Laterality Date   CESAREAN SECTION     x 2   CHOLECYSTECTOMY       Medications Prior to Admission  Medication Sig Dispense Refill Last Dose   acetaminophen (TYLENOL) 500 MG tablet Take 500 mg by mouth every 6 (six) hours as needed (for headaches).   Past Week   atorvastatin (LIPITOR) 10 MG tablet Take 10 mg by mouth daily.   10/12/2021 at 1400   ergocalciferol (VITAMIN D2) 1.25 MG (50000 UT) capsule Take 50,000 Units by mouth every Sunday.   Past Week   escitalopram (LEXAPRO) 10 MG tablet Take 10 mg by mouth daily.   10/12/2021   olmesartan (BENICAR) 40 MG tablet Take 40 mg by mouth daily.   10/12/2021 at 1400   zolpidem  (AMBIEN) 5 MG tablet Take 5 mg by mouth at bedtime.   10/12/2021 at pm    Inpatient Medications:   atorvastatin  10 mg Oral Daily   Chlorhexidine Gluconate Cloth  6 each Topical Daily   enoxaparin (LOVENOX) injection  40 mg Subcutaneous Q24H   escitalopram  10 mg Oral Daily   irbesartan  300 mg Oral Daily   sodium chloride flush  3 mL Intravenous Q12H    Allergies:  Allergies  Allergen Reactions   Lactose Intolerance (Gi) Diarrhea and Other (See Comments)    Scratchy throat, also   Morphine Sulfate Itching   Other Nausea Only and Other (See Comments)    Fried or spicy foods upset the stomach   Tape Other (See Comments)    Band-Aids irritate the skin   Tilactase Diarrhea    Social History   Socioeconomic History   Marital status: Divorced    Spouse name: Not on file   Number of children: Not on file   Years of education: Not on file   Highest education level: Not on file  Occupational History   Not on file  Tobacco Use   Smoking status: Never   Smokeless tobacco: Never  Vaping Use   Vaping Use: Never used  Substance and Sexual Activity     Alcohol use: Never   Drug use: Never   Sexual activity: Not on file  Other Topics Concern   Not on file  Social History Narrative   Not on file   Social Determinants of Health   Financial Resource Strain: Not on file  Food Insecurity: Not on file  Transportation Needs: Not on file  Physical Activity: Not on file  Stress: Not on file  Social Connections: Not on file  Intimate Partner Violence: Not on file     Family History  Problem Relation Age of Onset   Diabetes Mother    Pneumonia Father    Breast cancer Sister        43s     Review of Systems: All other systems reviewed and are otherwise negative except as noted above.  Physical Exam: Vitals:   10/14/21 0600 10/14/21 0630 10/14/21 0700 10/14/21 0730  BP: (!) 109/42 (!) 108/44 (!) 140/57 (!) 144/54  Pulse: (!) 34 (!) 32 (!) 34 (!) 34  Resp: 15 17 10 13    Temp:      TempSrc:      SpO2: 93% 94% 99% 95%  Weight: 68.2 kg     Height: 5' (1.524 m)       GEN- The patient is well appearing, alert and oriented x 3 today.   HEENT: normocephalic, atraumatic; sclera clear, conjunctiva pink; hearing intact; oropharynx clear; neck supple Lungs- Clear to ausculation bilaterally, normal work of breathing.  No wheezes, rales, rhonchi Heart- Regular rate and rhythm, no murmurs, rubs or gallops GI- soft, non-tender, non-distended, bowel sounds present Extremities- no clubbing, cyanosis, or edema; DP/PT/radial pulses 2+ bilaterally MS- no significant deformity or atrophy Skin- warm and dry, no rash or lesion Psych- euthymic mood, full affect Neuro- strength and sensation are intact  Labs:   Lab Results  Component Value Date   WBC 7.5 10/14/2021   HGB 12.2 10/14/2021   HCT 36.1 10/14/2021   MCV 86.8 10/14/2021   PLT 251 10/14/2021    Recent Labs  Lab 10/13/21 1722 10/14/21 0617  NA 136 140  K 4.3 4.4  CL 108 108  CO2 18* 22  BUN 19 15  CREATININE 1.22* 0.98  CALCIUM 9.9 9.7  PROT 7.2  --   BILITOT 0.4  --   ALKPHOS 66  --   ALT 27  --   AST 23  --   GLUCOSE 141* 104*      Radiology/Studies: DG Chest Portable 1 View  Result Date: 10/13/2021 CLINICAL DATA:  Bradycardia EXAM: PORTABLE CHEST 1 VIEW COMPARISON:  None Available. FINDINGS: The heart size and mediastinal contours are within normal limits. Both lungs are clear. The visualized skeletal structures are unremarkable. IMPRESSION: No active disease. Electronically Signed   By: 10/15/2021 M.D.   On: 10/13/2021 18:09    EKG: on arrival appears to show 2 vs 3 to 1 AV block (personally reviewed)  TELEMETRY: Advanced AV block with HRs as low as 34 in 2:1 AV block (personally reviewed)  Assessment/Plan: 1.  Advanced AV block No reversible causes thus far Leave NPO for now. Begin hydration.  Echo pending, if normal, [name] need pacing, if EF down, [name] likely need cath first.   Explained risks, benefits, and alternatives to PPM implantation, including but not limited to bleeding, infection, pneumothorax, pericardial effusion, lead dislodgement, heart attack, stroke, or death.  Pt verbalized understanding and agrees to proceed if indicated.   2. HTN [name] be able to adjust meds more freely  post pacing/intervention.   Dr. Elberta Fortis to see.  For questions or updates, please contact [facility] Please consult [email or web address] for contact info under Cardiology/STEMI.  Dustin Flock, PA-C  10/14/2021 7:39 AM   I have seen and examined this patient with Otilio Saber.  Agree with above, note added to reflect my findings.  Patient presented to her primary physician with symptoms of fatigue and weakness.  She was noted to be bradycardic.  Referred to the emergency room was found to be in 2-1 AV block.  On no rate controlling medications.  Comfortable in bed.  GEN: Well nourished, well developed, in no acute distress  HEENT: normal  Neck: no JVD, carotid bruits, or masses Cardiac: Bradycardic; no murmurs, rubs, or gallops,no edema  Respiratory:  clear to auscultation bilaterally, normal work of breathing GI: soft, nontender, nondistended, + BS MS: no deformity or atrophy  Skin: warm and dry Neuro:  Strength and sensation are intact Psych: euthymic mood, full affect   2-1 second-degree AV block: No reversible causes.  Patient has symptoms of fatigue and weakness.  We Sayyid Harewood plan for pacemaker implant.  Risk and benefits of been discussed which include bleeding, tamponade, infection, pneumothorax, lead dislodgment.  She understands these risks and is agreed to the procedure.  Diego Ulbricht M. Copeland Lapier MD 10/14/2021 1:00 PM

## 2021-10-14 NOTE — Progress Notes (Signed)
Lovenox held for tonight per cardiology. Pharmacy adjusted times

## 2021-10-14 NOTE — Progress Notes (Signed)
  Echocardiogram 2D Echocardiogram has been performed.  Pamela Ponce 10/14/2021, 1:06 PM

## 2021-10-14 NOTE — Progress Notes (Signed)
  Transition of Care [facility]) Screening Note   Patient Details  Name: Pamela Ponce Date of Birth: [birth year]   Transition of Care [facility]) CM/SW Contact:    Harriet Masson, RN Phone Number: 10/14/2021, 7:55 AM    Transition of Care Department [facility]) has reviewed patient and no TOC needs have been identified at this time. We will continue to monitor patient advancement through interdisciplinary progression rounds. If new patient transition needs arise, please place a TOC consult.

## 2021-10-15 ENCOUNTER — Observation Stay (HOSPITAL_COMMUNITY): Payer: Medicare Other

## 2021-10-15 ENCOUNTER — Encounter: Payer: Self-pay | Admitting: Student

## 2021-10-15 ENCOUNTER — Encounter (HOSPITAL_COMMUNITY): Payer: Self-pay | Admitting: Cardiology

## 2021-10-15 DIAGNOSIS — I10 Essential (primary) hypertension: Secondary | ICD-10-CM | POA: Diagnosis not present

## 2021-10-15 DIAGNOSIS — Z79899 Other long term (current) drug therapy: Secondary | ICD-10-CM | POA: Diagnosis not present

## 2021-10-15 DIAGNOSIS — M47814 Spondylosis without myelopathy or radiculopathy, thoracic region: Secondary | ICD-10-CM | POA: Diagnosis not present

## 2021-10-15 DIAGNOSIS — I441 Atrioventricular block, second degree: Secondary | ICD-10-CM | POA: Diagnosis not present

## 2021-10-15 DIAGNOSIS — Z950 Presence of cardiac pacemaker: Secondary | ICD-10-CM | POA: Diagnosis not present

## 2021-10-15 MED FILL — Lidocaine HCl Local Preservative Free (PF) Inj 1%: INTRAMUSCULAR | Qty: 30 | Status: AC

## 2021-10-15 NOTE — Discharge Summary (Addendum)
ELECTROPHYSIOLOGY PROCEDURE DISCHARGE SUMMARY    Patient ID: Pamela Ponce,  MRN: [id number], DOB/AGE: Feb 26, 1956 66 y.o.  Admit date: 10/13/2021 Discharge date: 10/15/2021  Primary Care Physician: Tally Joe, MD  Primary Cardiologist: Donato Schultz, MD  Electrophysiologist: Dr. Elberta Fortis  Primary Discharge Diagnosis:  Advanced AV block status post pacemaker implantation this admission  Secondary Discharge Diagnosis:  HTN  Allergies  Allergen Reactions   Lactose Intolerance (Gi) Diarrhea and Other (See Comments)    Scratchy throat, also   Morphine Sulfate Itching   Other Nausea Only and Other (See Comments)    Fried or spicy foods upset the stomach   Tape Other (See Comments)    Band-Aids irritate the skin   Tilactase Diarrhea     Procedures This Admission:  1.  Implantation of a Biotronik dual chamber PPM on 10/14/2021 by Dr. Elberta Fortis. The patient received a Biotronik model number  [id number] [id number]    PPM with model number  [id number]    right atrial lead and  [id number]   right ventricular lead. There were no immediate post procedure complications. 2.  CXR on  10/15/21 demonstrated no pneumothorax status post device implantation.   Brief HPI: Pamela Ponce is a 66 y.o. female was admitted for symptomatic bradycardia and electrophysiology team asked to see for consideration of PPM implantation.  Past medical history includes above.  The patient has had symptomatic bradycardia without reversible causes identified.  Risks, benefits, and alternatives to PPM implantation were reviewed with the patient who wished to proceed.   Hospital Course:  The patient was admitted and underwent implantation of a Biotronik dual chamber PPM with details as outlined above.  She was monitored on telemetry overnight which demonstrated appropriate pacing.  Left chest was without hematoma or ecchymosis.  The device was interrogated and found to be functioning normally.  CXR was obtained and demonstrated no  pneumothorax status post device implantation.  Wound care, arm mobility, and restrictions were reviewed with the patient.  The patient was examined and considered stable for discharge to home.    Physical Exam: Vitals:   10/14/21 2000 10/14/21 2320 10/15/21 0331 10/15/21 0826  BP: (!) 128/40 (!) 117/44 (!) 116/51 (!) 130/44  Pulse: 64 60 62   Resp: (!) 22 19 20 18   Temp: 97.9 F (36.6 C) 97.9 F (36.6 C) 98 F (36.7 C) 97.7 F (36.5 C)  TempSrc: Oral Oral Oral Oral  SpO2: 96% 96% 97% 97%  Weight:      Height:        GEN- The patient is well appearing, alert and oriented x 3 today.   HEENT: normocephalic, atraumatic; sclera clear, conjunctiva pink; hearing intact; oropharynx clear; neck supple, no JVP Lymph- no cervical lymphadenopathy Lungs- Clear to ausculation bilaterally, normal work of breathing.  No wheezes, rales, rhonchi Heart- Regular rate and rhythm, no murmurs, rubs or gallops, PMI not laterally displaced GI- soft, non-tender, non-distended, bowel sounds present, no hepatosplenomegaly Extremities- no clubbing, cyanosis, or edema; DP/PT/radial pulses 2+ bilaterally MS- no significant deformity or atrophy Skin- warm and dry, no rash or lesion, left chest without hematoma/ecchymosis Psych- euthymic mood, full affect Neuro- strength and sensation are intact   Labs:   Lab Results  Component Value Date   WBC 7.5 10/14/2021   HGB 12.2 10/14/2021   HCT 36.1 10/14/2021   MCV 86.8 10/14/2021   PLT 251 10/14/2021    Recent Labs  Lab 10/13/21 1722 10/14/21 0617  NA 136 140  K 4.3 4.4  CL 108 108  CO2 18* 22  BUN 19 15  CREATININE 1.22* 0.98  CALCIUM 9.9 9.7  PROT 7.2  --   BILITOT 0.4  --   ALKPHOS 66  --   ALT 27  --   AST 23  --   GLUCOSE 141* 104*    Discharge Medications:  Allergies as of 10/15/2021       Reactions   Lactose Intolerance (gi) Diarrhea, Other (See Comments)   Scratchy throat, also   Morphine Sulfate Itching   Other Nausea Only, Other  (See Comments)   Fried or spicy foods upset the stomach   Tape Other (See Comments)   Band-Aids irritate the skin   Tilactase Diarrhea        Medication List     TAKE these medications    acetaminophen 500 MG tablet Commonly known as: TYLENOL Take 500 mg by mouth every 6 (six) hours as needed (for headaches).   atorvastatin 10 MG tablet Commonly known as: LIPITOR Take 10 mg by mouth daily.   ergocalciferol 1.25 MG (50000 UT) capsule Commonly known as: VITAMIN D2 Take 50,000 Units by mouth every Sunday.   escitalopram 10 MG tablet Commonly known as: LEXAPRO Take 10 mg by mouth daily.   olmesartan 40 MG tablet Commonly known as: BENICAR Take 40 mg by mouth daily.   zolpidem 5 MG tablet Commonly known as: AMBIEN Take 5 mg by mouth at bedtime.        Disposition:    Follow-up Information     [facility] Follow up.   Why: on 7/13 at 1040 am for post pacer check Contact information: [address] [place] [address] [phone]                Duration of Discharge Encounter: Greater than 30 minutes including physician time.  Signed, Graciella Freer, PA-C  10/15/2021 8:55 AM   I have seen and examined this patient with Otilio Saber.  Agree with above, note added to reflect my findings.  On exam, RRR, no murmurs, lungs clear.  She is now status post Biotronik pacemaker for second degree AV block.  Device functioning appropriately.  Chest x-ray and interrogation without issue.  Plan for discharge today with follow-up in device clinic.  Shaquandra Galano M. Ambur Province MD 10/15/2021 9:26 AM

## 2021-10-15 NOTE — Plan of Care (Signed)
  Problem: Health Behavior/Discharge Planning: Goal: Ability to manage health-related needs will improve Outcome: Progressing   

## 2021-10-15 NOTE — Progress Notes (Signed)
Discharge instructions reviewed with pt and family.  Copy of instructions given to pt, pt informed of scripts sent to her pharmacy.  Pt d/c'd via wheelchair with belongings, with family.            Escorted by unit staff.

## 2021-10-15 NOTE — Discharge Instructions (Signed)
After Your Pacemaker   You have a Biotronik Pacemaker  ACTIVITY Do not lift your arm above shoulder height for 1 week after your procedure. After 7 days, you may progress as below.  You should remove your sling 24 hours after your procedure, unless otherwise instructed by your provider.     Thursday October 22, 2021  Friday October 23, 2021 Saturday October 24, 2021 Sunday October 25, 2021   Do not lift, push, pull, or carry anything over 10 pounds with the affected arm until 6 weeks (Thursday November 26, 2021 ) after your procedure.   You may drive AFTER your wound check, unless you have been told otherwise by your provider.   Ask your healthcare provider when you can go back to work   INCISION/Dressing If you are on a blood thinner such as Coumadin, Xarelto, Eliquis, Plavix, or Pradaxa please confirm with your provider when this should be resumed.   If large square, outer bandage is left in place, this can be removed after 24 hours from your procedure. Do not remove steri-strips or glue as below.   Monitor your Pacemaker site for redness, swelling, and drainage. Call the device clinic at [phone] if you experience these symptoms or fever/chills.  If your incision is sealed with Steri-strips or staples, you may shower 7 days after your procedure or when told by your provider. Do not remove the steri-strips or let the shower hit directly on your site. You may wash around your site with soap and water.    If you were discharged in a sling, please do not wear this during the day more than 48 hours after your surgery unless otherwise instructed. This may increase the risk of stiffness and soreness in your shoulder.   Avoid lotions, ointments, or perfumes over your incision until it is well-healed.  You may use a hot tub or a pool AFTER your wound check appointment if the incision is completely closed.  Pacemaker Alerts:  Some alerts are vibratory and others beep. These are NOT emergencies. Please  call our office to let us know. If this occurs at night or on weekends, it can wait until the next business day. Send a remote transmission.  If your device is capable of reading fluid status (for heart failure), you will be offered monthly monitoring to review this with you.   DEVICE MANAGEMENT Remote monitoring is used to monitor your pacemaker from home. This monitoring is scheduled every 91 days by our office. It allows us to keep an eye on the functioning of your device to ensure it is working properly. You will routinely see your Electrophysiologist annually (more often if necessary).   You should receive your ID card for your new device in 4-8 weeks. Keep this card with you at all times once received. Consider wearing a medical alert bracelet or necklace.  Your Pacemaker may be MRI compatible. This will be discussed at your next office visit/wound check.  You should avoid contact with strong electric or magnetic fields.   Do not use amateur (ham) radio equipment or electric (arc) welding torches. MP3 player headphones with magnets should not be used. Some devices are safe to use if held at least 12 inches (30 cm) from your Pacemaker. These include power tools, lawn mowers, and speakers. If you are unsure if something is safe to use, ask your health care provider.  When using your cell phone, hold it to the ear that is on the opposite side from the   Pacemaker. Do not leave your cell phone in a pocket over the Pacemaker.  You may safely use electric blankets, heating pads, computers, and microwave ovens.  Call the office right away if: You have chest pain. You feel more short of breath than you have felt before. You feel more light-headed than you have felt before. Your incision starts to open up.  This information is not intended to replace advice given to you by your health care provider. Make sure you discuss any questions you have with your health care provider.

## 2021-10-15 NOTE — TOC Transition Note (Signed)
Transition of Care (TOC) - CM/SW Discharge Note Donn Pierini RN, BSN Transitions of Care Unit 4E- RN Case Manager See Treatment Team for direct phone #    Patient Details  Name: Pamela Ponce MRN: [id number] Date of Birth: 11-09-1955  Transition of Care [facility]) CM/SW Contact:  Darrold Span, RN Phone Number: 10/15/2021, 12:05 PM   Clinical Narrative:    Pt stable for transition home today s/p PPM. Transition of Care Department [facility]) has reviewed patient and no TOC needs have been identified at this time.   Final next level of care: Home/Self Care Barriers to Discharge: No Barriers Identified   Patient Goals and CMS Choice    NA    Discharge Placement               Home        Discharge Plan and Services                                     Social Determinants of Health (SDOH) Interventions     Readmission Risk Interventions    10/15/2021   12:04 PM  Readmission Risk Prevention Plan  Post Dischage Appt Complete  Medication Screening Complete  Transportation Screening Complete

## 2021-10-15 NOTE — Progress Notes (Signed)
Jury duty

## 2021-10-15 NOTE — Plan of Care (Signed)
  Problem: Education: Goal: Understanding of cardiac disease, CV risk reduction, and recovery process will improve Outcome: Adequate for Discharge Goal: Individualized Educational Video(s) Outcome: Adequate for Discharge   Problem: Activity: Goal: Ability to tolerate increased activity will improve Outcome: Adequate for Discharge   Problem: Cardiac: Goal: Ability to achieve and maintain adequate cardiovascular perfusion will improve Outcome: Adequate for Discharge   Problem: Health Behavior/Discharge Planning: Goal: Ability to safely manage health-related needs after discharge will improve Outcome: Adequate for Discharge   Problem: Education: Goal: Knowledge of General Education information will improve Description: Including pain rating scale, medication(s)/side effects and non-pharmacologic comfort measures Outcome: Adequate for Discharge   Problem: Health Behavior/Discharge Planning: Goal: Ability to manage health-related needs will improve Outcome: Adequate for Discharge   Problem: Clinical Measurements: Goal: Ability to maintain clinical measurements within normal limits will improve Outcome: Adequate for Discharge Goal: Will remain free from infection Outcome: Adequate for Discharge Goal: Diagnostic test results will improve Outcome: Adequate for Discharge Goal: Respiratory complications will improve Outcome: Adequate for Discharge Goal: Cardiovascular complication will be avoided Outcome: Adequate for Discharge   Problem: Activity: Goal: Risk for activity intolerance will decrease Outcome: Adequate for Discharge   Problem: Nutrition: Goal: Adequate nutrition will be maintained Outcome: Adequate for Discharge   Problem: Coping: Goal: Level of anxiety will decrease Outcome: Adequate for Discharge   Problem: Elimination: Goal: Will not experience complications related to bowel motility Outcome: Adequate for Discharge Goal: Will not experience complications  related to urinary retention Outcome: Adequate for Discharge   Problem: Pain Managment: Goal: General experience of comfort will improve Outcome: Adequate for Discharge   Problem: Safety: Goal: Ability to remain free from injury will improve Outcome: Adequate for Discharge   Problem: Skin Integrity: Goal: Risk for impaired skin integrity will decrease Outcome: Adequate for Discharge   

## 2021-10-29 ENCOUNTER — Ambulatory Visit (INDEPENDENT_AMBULATORY_CARE_PROVIDER_SITE_OTHER): Payer: Medicare Other

## 2021-10-29 DIAGNOSIS — I441 Atrioventricular block, second degree: Secondary | ICD-10-CM | POA: Diagnosis not present

## 2021-10-29 LAB — CUP PACEART INCLINIC DEVICE CHECK
Date Time Interrogation Session: 20230713105503
Implantable Lead Implant Date: 20230630
Implantable Lead Implant Date: 20230630
Implantable Lead Location: 753859
Implantable Lead Location: 753860
Implantable Lead Model: 377
Implantable Lead Model: 377
Implantable Lead Serial Number: 8000862625
Implantable Lead Serial Number: 800921449
Implantable Pulse Generator Implant Date: 20230630
Pulse Gen Model: 407145
Pulse Gen Serial Number: 70444647

## 2021-10-29 NOTE — Patient Instructions (Signed)

## 2021-10-29 NOTE — Progress Notes (Signed)
Wound check appointment. Steri-strips removed. Wound without redness or edema. Incision edges approximated, wound well healed. Normal device function. Thresholds, sensing, and impedances consistent with implant measurements. Device programmed at 3.0V/auto capture programmed on for extra safety margin until 3 month visit. Histogram distribution appropriate for patient and level of activity. No mode switches or high ventricular rates noted. Patient educated about wound care, arm mobility, lifting restrictions. ROV in 3 months with implanting physician.  Error in saving attachment. See scanned media for report.

## 2021-11-03 ENCOUNTER — Other Ambulatory Visit: Payer: Self-pay | Admitting: Cardiology

## 2021-12-16 DIAGNOSIS — H2513 Age-related nuclear cataract, bilateral: Secondary | ICD-10-CM | POA: Diagnosis not present

## 2022-01-13 DIAGNOSIS — M816 Localized osteoporosis [Lequesne]: Secondary | ICD-10-CM | POA: Diagnosis not present

## 2022-01-15 ENCOUNTER — Ambulatory Visit (INDEPENDENT_AMBULATORY_CARE_PROVIDER_SITE_OTHER): Payer: Medicare Other

## 2022-01-15 DIAGNOSIS — I441 Atrioventricular block, second degree: Secondary | ICD-10-CM

## 2022-01-15 LAB — CUP PACEART REMOTE DEVICE CHECK
Date Time Interrogation Session: 20230929140016
Implantable Lead Implant Date: 20230630
Implantable Lead Implant Date: 20230630
Implantable Lead Location: 753859
Implantable Lead Location: 753860
Implantable Lead Model: 377
Implantable Lead Model: 377
Implantable Lead Serial Number: 8000862625
Implantable Lead Serial Number: 800921449
Implantable Pulse Generator Implant Date: 20230630
Pulse Gen Model: 407145
Pulse Gen Serial Number: 70444647

## 2022-01-20 ENCOUNTER — Encounter: Payer: Medicare Other | Admitting: Cardiology

## 2022-01-20 NOTE — Progress Notes (Signed)
Remote pacemaker transmission.   

## 2022-02-17 ENCOUNTER — Encounter: Payer: Self-pay | Admitting: Cardiology

## 2022-02-17 ENCOUNTER — Ambulatory Visit: Payer: Medicare Other | Attending: Cardiology | Admitting: Cardiology

## 2022-02-17 VITALS — BP 142/72 | HR 81 | Ht 60.0 in | Wt 154.0 lb

## 2022-02-17 DIAGNOSIS — I441 Atrioventricular block, second degree: Secondary | ICD-10-CM

## 2022-02-17 LAB — CUP PACEART INCLINIC DEVICE CHECK
Brady Statistic RA Percent Paced: 9 %
Brady Statistic RV Percent Paced: 97 %
Date Time Interrogation Session: 20231101161253
Implantable Lead Connection Status: 753985
Implantable Lead Connection Status: 753985
Implantable Lead Implant Date: 20230630
Implantable Lead Implant Date: 20230630
Implantable Lead Location: 753859
Implantable Lead Location: 753860
Implantable Lead Model: 377
Implantable Lead Model: 377
Implantable Lead Serial Number: 8000862625
Implantable Lead Serial Number: 800921449
Implantable Pulse Generator Implant Date: 20230630
Lead Channel Impedance Value: 585 Ohm
Lead Channel Impedance Value: 643 Ohm
Lead Channel Pacing Threshold Amplitude: 0.6 V
Lead Channel Pacing Threshold Amplitude: 0.8 V
Lead Channel Pacing Threshold Pulse Width: 0.4 ms
Lead Channel Pacing Threshold Pulse Width: 0.4 ms
Lead Channel Sensing Intrinsic Amplitude: 5 mV
Lead Channel Sensing Intrinsic Amplitude: 585 mV
Lead Channel Sensing Intrinsic Amplitude: 643 mV
Lead Channel Sensing Intrinsic Amplitude: 9.4 mV
Pulse Gen Model: 407145
Pulse Gen Serial Number: 70444647

## 2022-02-17 NOTE — Progress Notes (Signed)
Electrophysiology Office Note   Date:  02/17/2022   ID:  Pamela Ponce, DOB 10-30-1955, MRN [id number]  PCP:  Antony Contras, MD  Cardiologist:  Marlou Porch Primary Electrophysiologist:  Yamaris Cummings Meredith Leeds, MD    Chief Complaint: pacemaker   History of Present Illness: Pamela Ponce is a 66 y.o. female who is being seen today for the evaluation of pacemaker at the request of Antony Contras, MD. Presenting today for electrophysiology evaluation.  Has a history significant hypertension and hyperlipidemia.  She presented to the hospital June 2023 with this and fatigue, found to be in 2-1 AV block.  She was found to be in heart block.  She is now status post Biotronik dual-chamber pacemaker implanted 10/14/2021.  Today, she denies symptoms of palpitations, chest pain, shortness of breath, orthopnea, PND, lower extremity edema, claudication, dizziness, presyncope, syncope, bleeding, or neurologic sequela. The patient is tolerating medications without difficulties.  Pacemaker was implanted she has done well.  She has much more energy.  She is able to do all her daily activities.  She is excited to start exercising.   Past Medical History:  Diagnosis Date   Hyperlipidemia    Hypertension    Past Surgical History:  Procedure Laterality Date   CESAREAN SECTION     x 2   CHOLECYSTECTOMY     PACEMAKER IMPLANT N/A 10/14/2021   Procedure: PACEMAKER IMPLANT;  Surgeon: Constance Haw, MD;  Location: [place] [facility];  Service: Cardiovascular;  Laterality: N/A;     Current Outpatient Medications  Medication Sig Dispense Refill   acetaminophen (TYLENOL) 500 MG tablet Take 500 mg by mouth every 6 (six) hours as needed (for headaches).     atorvastatin (LIPITOR) 10 MG tablet Take 10 mg by mouth daily.     ergocalciferol (VITAMIN D2) 1.25 MG (50000 UT) capsule Take 50,000 Units by mouth every Sunday.     escitalopram (LEXAPRO) 10 MG tablet Take 10 mg by mouth daily.     olmesartan (BENICAR)  40 MG tablet Take 40 mg by mouth daily.     zolpidem (AMBIEN) 5 MG tablet Take 5 mg by mouth at bedtime.     No current facility-administered medications for this visit.    Allergies:   Lactose intolerance (gi), Morphine sulfate, Other, Tape, and Tilactase   Social History:  The patient  reports that she has never smoked. She has never used smokeless tobacco. She reports that she does not drink alcohol and does not use drugs.   Family History:  The patient's family history includes Breast cancer in her sister; Diabetes in her mother; Pneumonia in her father.    ROS:  Please see the history of present illness.   Otherwise, review of systems is positive for none.   All other systems are reviewed and negative.    PHYSICAL EXAM: VS:  BP (!) 142/72   Pulse 81   Ht 5' (1.524 m)   Wt 154 lb (69.9 kg)   SpO2 96%   BMI 30.08 kg/m  , BMI Body mass index is 30.08 kg/m. GEN: Well nourished, well developed, in no acute distress  HEENT: normal  Neck: no JVD, carotid bruits, or masses Cardiac: RRR; no murmurs, rubs, or gallops,no edema  Respiratory:  clear to auscultation bilaterally, normal work of breathing GI: soft, nontender, nondistended, + BS MS: no deformity or atrophy  Skin: warm and dry Neuro:  Strength and sensation are intact Psych: euthymic mood, full affect  EKG:  EKG  is ordered today. Personal review of the ekg ordered shows sinus rhythm, ventricular paced  Recent Labs: 10/13/2021: ALT 27; TSH 2.762 10/14/2021: BUN 15; Creatinine, Ser 0.98; Hemoglobin 12.2; Platelets 251; Potassium 4.4; Sodium 140    Lipid Panel  No results found for: "CHOL", "TRIG", "HDL", "CHOLHDL", "VLDL", "LDLCALC", "LDLDIRECT"   Wt Readings from Last 3 Encounters:  02/17/22 154 lb (69.9 kg)  10/14/21 150 lb 5.7 oz (68.2 kg)      Other studies Reviewed: Additional studies/ records that were reviewed today include: TTE 10/14/21  Review of the above records today demonstrates:   1. Left  ventricular ejection fraction, by estimation, is 70 to 75%. The  left ventricle has hyperdynamic function. The left ventricle has no  regional wall motion abnormalities. Left ventricular diastolic parameters  were normal.   2. Right ventricular systolic function is normal. The right ventricular  size is normal.   3. The mitral valve is normal in structure. Mild mitral valve  regurgitation. No evidence of mitral stenosis.   4. The aortic valve is normal in structure. Aortic valve regurgitation is  not visualized. No aortic stenosis is present.   5. The inferior vena cava is normal in size with greater than 50%  respiratory variability, suggesting right atrial pressure of 3 mmHg.    ASSESSMENT AND PLAN:  1.  Second-degree 2 1 AV block: Status post Biotronik dual-chamber pacemaker implanted 10/14/2021.  Device functioning appropriately.  No changes at this time.  2.  Hypertension: Mildly elevated today.  Usually well controlled.  No changes.  Current medicines are reviewed at length with the patient today.   The patient does not have concerns regarding her medicines.  The following changes were made today:  none  Labs/ tests ordered today include:  Orders Placed This Encounter  Procedures   EKG 12-Lead     Disposition:   FU with Tasheika Kitzmiller 1 year  Signed, Krishawn Vanderweele Jorja Loa, MD  02/17/2022 3:47 PM     [facility] [address] [place] [address] [phone] (office) [phone] (fax)

## 2022-03-29 DIAGNOSIS — E782 Mixed hyperlipidemia: Secondary | ICD-10-CM | POA: Diagnosis not present

## 2022-03-29 DIAGNOSIS — I10 Essential (primary) hypertension: Secondary | ICD-10-CM | POA: Diagnosis not present

## 2022-03-29 DIAGNOSIS — Z1211 Encounter for screening for malignant neoplasm of colon: Secondary | ICD-10-CM | POA: Diagnosis not present

## 2022-03-29 DIAGNOSIS — Z950 Presence of cardiac pacemaker: Secondary | ICD-10-CM | POA: Diagnosis not present

## 2022-03-29 DIAGNOSIS — Z23 Encounter for immunization: Secondary | ICD-10-CM | POA: Diagnosis not present

## 2022-03-29 DIAGNOSIS — Z0000 Encounter for general adult medical examination without abnormal findings: Secondary | ICD-10-CM | POA: Diagnosis not present

## 2022-03-29 DIAGNOSIS — M810 Age-related osteoporosis without current pathological fracture: Secondary | ICD-10-CM | POA: Diagnosis not present

## 2022-03-29 DIAGNOSIS — E559 Vitamin D deficiency, unspecified: Secondary | ICD-10-CM | POA: Diagnosis not present

## 2022-03-29 DIAGNOSIS — G4700 Insomnia, unspecified: Secondary | ICD-10-CM | POA: Diagnosis not present

## 2022-04-16 LAB — CUP PACEART REMOTE DEVICE CHECK
Battery Voltage: 100
Date Time Interrogation Session: 20231224081837
Implantable Lead Connection Status: 753985
Implantable Lead Connection Status: 753985
Implantable Lead Implant Date: 20230630
Implantable Lead Implant Date: 20230630
Implantable Lead Location: 753859
Implantable Lead Location: 753860
Implantable Lead Model: 377
Implantable Lead Model: 377
Implantable Lead Serial Number: 8000862625
Implantable Lead Serial Number: 800921449
Implantable Pulse Generator Implant Date: 20230630
Pulse Gen Model: 407145
Pulse Gen Serial Number: 70444647

## 2022-05-06 DIAGNOSIS — M810 Age-related osteoporosis without current pathological fracture: Secondary | ICD-10-CM | POA: Diagnosis not present

## 2022-05-18 ENCOUNTER — Other Ambulatory Visit: Payer: Self-pay

## 2022-05-18 ENCOUNTER — Ambulatory Visit: Payer: Medicare Other | Attending: Sports Medicine | Admitting: Physical Therapy

## 2022-05-18 ENCOUNTER — Encounter: Payer: Self-pay | Admitting: Physical Therapy

## 2022-05-18 DIAGNOSIS — M6281 Muscle weakness (generalized): Secondary | ICD-10-CM | POA: Diagnosis not present

## 2022-05-18 DIAGNOSIS — M5459 Other low back pain: Secondary | ICD-10-CM | POA: Insufficient documentation

## 2022-05-18 NOTE — Therapy (Addendum)
OUTPATIENT PHYSICAL THERAPY THORACOLUMBAR EVALUATION   Patient Name: Pamela Ponce MRN: [id number] DOB:[birth year], 67 y.o., female Today's Date: 05/18/2022  END OF SESSION:  PT End of Session - 05/18/22 1013     Visit Number 1    Number of Visits 5    Date for PT Re-Evaluation 06/22/22    Authorization Type [employer] MEDICARE    Progress Note Due on Visit 10    PT Start Time 0930    PT Stop Time 1010    PT Time Calculation (min) 40 min    Activity Tolerance Patient tolerated treatment well    Behavior During Therapy WFL for tasks assessed/performed             Past Medical History:  Diagnosis Date   Hyperlipidemia    Hypertension    Past Surgical History:  Procedure Laterality Date   CESAREAN SECTION     x 2   CHOLECYSTECTOMY     PACEMAKER IMPLANT N/A 10/14/2021   Procedure: PACEMAKER IMPLANT;  Surgeon: Constance Haw, MD;  Location: [place] [facility];  Service: Cardiovascular;  Laterality: N/A;   Patient Active Problem List   Diagnosis Date Noted   AV block, [place] II 10/13/2021    REFERRING PROVIDER: Verner Chol, MD   REFERRING DIAG: Osteoporosis M81.0  Rationale for Evaluation and Treatment: Rehabilitation  THERAPY DIAG:  Muscle weakness (generalized) - Plan: PT plan of care cert/re-cert  Other low back pain - Plan: PT plan of care cert/re-cert  ONSET DATE: Ongoing for 5+ years, with most recent bone density scan showing a decrease.   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 She denies any recent falls, but states that she had a recent onset of lower back pain. She notes increased pain after sedentary bouts. Pt recently got a pacemaker placed in June 2023, which resulted in her reducing her physical activity.   PERTINENT  HISTORY:  HTN, pacemaker   PAIN:  Are you having pain? Yes: NPRS scale: 1-2/10 Pain location: L flank  Pain description: Achy pain Aggravating factors: Getting up after being sedentary  Relieving factors: Tyenol,   PRECAUTIONS: ICD/Pacemaker and Other: Osteoporosis   WEIGHT BEARING RESTRICTIONS: No  FALLS:  Has patient fallen in last 6 months? No  LIVING ENVIRONMENT: Lives with: lives with their family Lives in: House/apartment Stairs: Yes: External: 3-4 steps; none Has following equipment at home:  None  OCCUPATION: Retired   PLOF: Independent  PATIENT GOALS: Pt would like to start incorporating physical activity into her daily life to reduce continued bone density loss.    NEXT MD VISIT:   OBJECTIVE:   DIAGNOSTIC FINDINGS:  None recently    SCREENING FOR RED FLAGS: Bowel or bladder incontinence: No  COGNITION: Overall cognitive status: Within functional limits for tasks assessed     SENSATION: WFL  POSTURE: No Significant postural limitations  PALPATION: None.   LUMBAR ROM:   AROM eval  Flexion WFL  Extension 85%   Right lateral flexion WFL  Left lateral flexion WFL  Right rotation WFL  Left rotation WFL   (Blank rows = not tested)  LOWER EXTREMITY ROM:     Active  Right eval Left eval  Hip flexion [facility]  Knee flexion [facility] WFL  Knee extension WFL WFL   (Blank rows = not tested)  LOWER EXTREMITY MMT:    MMT Right eval Left eval  Hip flexion [facility]  Knee flexion [facility] WFL  Knee extension WFL WFL   (Blank rows = not tested)  FUNCTIONAL TESTS:  5 times sit to stand: 9.25 sec   GAIT: Distance walked: 71ft Assistive device utilized: None Level of assistance: Complete Independence Comments: None  TODAY'S TREATMENT:                                                                                                                              DATE: Creating, reviewing, and completing below HEP    PATIENT EDUCATION:  Education  details: Educated pt on anatomy and physiology of current symptoms,  diagnosis, prognosis, HEP,  and POC. Person educated: Patient Education method: Customer service manager Education comprehension: verbalized understanding and returned demonstration  HOME EXERCISE PROGRAM: Access Code: [email or web address] URL: [email or web address] Date: 05/18/2022 Prepared by: Rudi Heap  Exercises - Supine Lower Trunk Rotation  - 1 x daily - 7 x weekly - 2 sets - 10 reps - Supine Single Knee to Chest Stretch  - 1 x daily - 7 x weekly - 2 sets - 10 reps - Supine Bridge  - 1 x daily - 7 x weekly - 2 sets - 10 reps - Supine Posterior Pelvic Tilt  - 1 x daily - 7 x weekly - 2 sets - 10 reps - Bent Knee Fallouts  - 1 x daily - 7 x weekly - 3 sets - 10 reps  ASSESSMENT:  CLINICAL IMPRESSION: Patient is a 67 y.o. F who was seen today for physical therapy evaluation and treatment for osteoporosis. She demonstrates decreased strength in her paraspinals, glutes and core. Pt also reports a recent onset of lower back pain. Pt denies any falls and has no significant gait deviations. Pt was provided an initial HEP to help modulate lower back pain and strength training. Plan to progress HEP over next 4 weeks with core and overall strength training to help decrease progression of osteoporosis.  Pt was provided a comprehensive packet with precautions and education on recent diagnosis. Pt will continue to benefit from skilled PT to address continued deficits.    OBJECTIVE IMPAIRMENTS: cardiopulmonary status limiting activity, decreased activity tolerance, decreased balance, decreased endurance, decreased knowledge of condition, decreased knowledge of use of DME, decreased mobility, decreased ROM, decreased strength, decreased safety awareness, increased muscle spasms, impaired flexibility, improper body mechanics, obesity, and pain.   ACTIVITY LIMITATIONS: carrying, lifting, bending, squatting, sleeping,  transfers, dressing, reach over head, locomotion level, and caring for others  PARTICIPATION LIMITATIONS: meal prep, cleaning, laundry, interpersonal relationship, shopping, community activity, and yard work  PERSONAL FACTORS: Age, Behavior pattern, Education, Fitness, Past/current experiences, Sex, Time since onset of injury/illness/exacerbation, and 1-2 comorbidities: HTN, pacemaker   are also affecting patient's functional outcome.   REHAB POTENTIAL: Good  CLINICAL DECISION MAKING: Stable/uncomplicated  EVALUATION COMPLEXITY: Low   GOALS: Target Date: 06/22/2022 Goals reviewed with patient? No  Patient will verbally understand ways to manage osteoporosis with diet and correct to reduce strain postures on spine.   Patient will verbally understand correct body mechanics for home and work tasks to decrease strain on spine.   Patient will verbally understand ways to strengthen postural musculature.  Initiate HEP focusing on managing osteoporosis postural deficits and strength deficits.  Patient can verbally understand the dos and don'ts of osteoporosis management.  PLAN:  PT FREQUENCY: 1-2x/week  PT DURATION: 4 weeks  PLANNED INTERVENTIONS: Therapeutic exercises, Therapeutic activity, Neuromuscular re-education, Balance training, Gait training, Patient/Family education, Self Care, Joint mobilization, Joint manipulation, Stair training, Vestibular training, Aquatic Therapy, Dry Needling, Electrical stimulation, Spinal manipulation, Spinal mobilization, Cryotherapy, Moist heat, Taping, Vasopneumatic device, Traction, Ultrasound, Manual therapy, and Re-evaluation.  PLAN FOR NEXT SESSION: Review and Progress HEP.    Lynden Ang, PT 05/18/2022, 10:15 AM

## 2022-05-25 ENCOUNTER — Ambulatory Visit: Payer: Medicare Other | Attending: Sports Medicine | Admitting: Rehabilitative and Restorative Service Providers"

## 2022-05-25 ENCOUNTER — Encounter: Payer: Self-pay | Admitting: Rehabilitative and Restorative Service Providers"

## 2022-05-25 DIAGNOSIS — M6281 Muscle weakness (generalized): Secondary | ICD-10-CM | POA: Insufficient documentation

## 2022-05-25 DIAGNOSIS — M5459 Other low back pain: Secondary | ICD-10-CM | POA: Diagnosis not present

## 2022-05-25 NOTE — Patient Instructions (Signed)
RE-ALIGNMENT ROUTINE EXERCISES-OSTEOPROROSIS BASIC FOR POSTURAL CORRECTION   RE-ALIGNMENT Tips BENEFITS: 1.It helps to re-align the curves of the back and improve standing posture. 2.It allows the back muscles to rest and strengthen in preparation for more activity. FREQUENCY: Daily, even after weeks, months and years of more advanced exercises. START: 1.All exercises start in the same position: lying on the back, arms resting on the supporting surface, palms up and slightly away from the body, backs of hands down, knees bent, feet flat. 2.The head, neck, arms, and legs are supported according to specific instructions of your therapist. Copyright  VHI. All rights reserved.    1. Decompression Exercise: Basic.   Takes compression off the vertebral bodies; increases tolerance for lying on the back; helps relieve back pain   Lie on back on firm surface, knees bent, feet flat, arms turned up, out to sides (~35 degrees). Head neck and arms supported as necessary. Time _5-15__ minutes. Surface: floor     2. Shoulder Press  Strengthens upper back extensors and scapular retractors.   Press both shoulders down. Hold _2-3__ seconds. Repeat _3-5__ times. Surface: floor        3. Head Press With [place]  Strengthens neck extensors   Tuck chin SLIGHTLY toward chest, keep mouth closed. Feel weight on back of head. Increase weight by pressing head down. Hold _2-3__ seconds. Relax. Repeat 3-5___ times. Surface: floor   4. Leg Lengthener: stretches quadratus lumborum and hip flexors.  Strengthens quads and ankle dorsiflexors.            Osteoporosis   What is Osteoporosis?  A silent disease in which the skeleton is weakened by decreased bone density. Characterized by low bone mass, deterioration of bone, and increased risk of fracture postmenopausal (primary) or the result of an identifiable condition/event (secondary) Commonly found in the wrists, spine, and hips; these are  high-risk stress areas and very susceptible to fractures.  The Facts: There are 1.5 million fractures/year 500,000 spine; 250,000 hip with over 60,000 nursing home admissions secondary to hip fracture; and 200,000 wrist After hip fracture, only 50% of people able to walk independently prior to the fracture return to independent ambulation. Bone mass: Peaks at age 41-30, and begins declining at age 24-50.   Osteoporosis is defined by the [facility]) as:  NOF/WHO Criteria for Interpreting Results of Bone Density Assessment  Results Diagnosis  Within 1 standard deviation (SD) of young adult mean Normal  Between 1 and -2.5 SD below mean, repeat in 2 years Low bone mass (osteopenia)  Greater than -2.5 SD below mean Osteoporosis  Greater than -2.5 SD below mean and one or more fragility fractures exist Severe Osteoporosis  *Results can be affected by positioning of the body in the DEXA scan, presence of current or old fractures, arthritis, extraneous calcifications.    Osteoporosis is not just a women's disease!  30-40% of women will develop osteoporosis 5-15% of males will develop osteoporosis   What are the risk factors?  Female Thin, small frame Caucasian, Asian race Early menopause (<63 years old)/amenorrhea/delayed puberty Old age Family history (fractures, stooped posture)\ Low calcium diet Sedentary lifestyle Alcohol, Caffeine, Smoking Malnutrition, GI Disease Prolonged use of Glucocorticoids (Prednisone), Meds to treat asthma, arthritis, cancers, thyroid, and anti-seizure meds.  How do I know for sure?  Get a BONE DENSITY TEST!  This measures bone loss and it's painless, non-invasive, and only takes 5-10 minutes!  What can I do about it?  Decrease your  risk factors (alcohol, caffeine, smoking) Helpful medications (see next page) Adequate Calcium and Vitamin D intake Get active! Proper posture - Sit and stand tall! No slouching or  twisting Weight-Bearing Exercise - walking, stair climbing, elliptical; NO jogging or high-impact exercise. Resistive Exercise - Cybex weight equipment, Nautilus, dumbbells, therabands  **Be sure to maintain proper alignment when lifting any weight!!  **When using equipment, avoid abdominal exercises which involve "crunching" or curling or twisting the trunk, biceps machines, cross-country machines, moving handlebars, or ANY MACHINE WITH ROTATION OR FORWARD BENDING!!!           Approved Pharmacologic Management of Osteoporosis  Agent Approved for prevention Approved for treatment BMD increased spine/hip Fracture reduction  Estrogen/Hormone Therapy (Estrace, Estratab, Ogen, Premarin, Vivell, Prempro, Femhert, Orthoest) Yes Yes 3-6% 35% spine and hip  Bisphosphonates  (Fosamax, Actonel, Boniva) Yes Yes 3-8% 35-50% spine and non-spine  Calcitonin (Miacalcin, Calcimar, Fortical) No Yes 0-3% None stated  Raloxifene (Evista) Yes Yes 2-3% 30-55%  Parathyroid Hormone (Forteo) No Yes, only in those at high risk for fracture None stated 53-65%     Recommended Daily Calcium Intakes   Population Group NIH/NOF* (mg elemental calcium)  Children 1-10 years [phone]  Children 11-24 years 28-1500  Men and Women 25-64 years At least 1200  Pregnant/Lactating At least 1200  Postmenopausal women with hormone replacement therapy At least 1200  Postmenopausal women without   hormone replacement therapy At least 1200  Men and women 65 + At least 1200  *In 1987, 1990, 1994, and 2000, the NIH held consensus conferences on osteoporosis and calcium.  This column shows the most recent recommendations regarding calcium intak for preventing and managing osteoporosis.          Calcium Content of Selected Foods  Dairy Foods Calcium Content (mg) Non-Dairy Foods Calcium Content (mg)  Buttermilk, 1 cup 300 Calcium-fortified juice, 1 cup 300  Milk, 1 cup 300 Salmon, canned with Bones, 2 oz  100  Lactaid milk, 1 cup 300-500 Oysters, raw 13-19 medium 226  Soy milk, 1 cup 200-300 Sardines, canned with bones, 3 oz 372  Yogurt (plain, lowfat) 1 cup 250-300 Shrimp, canned 3 oz 98  Frozen yogurt (fruit) 1 cup 200-600 Collard greens, cooked 1 cup 357  Cheddar, mozzarella, or [place] cheese, 1oz 205 Broccoli, cooked 1 cup 78  Cottage cheese (lowfat) 4 oz 200 Soybeans, cooked 1 cup 131  Part-skim ricotta cheese, 4oz 335 Tofu, 4oz* *  Vanilla ice cream, 1 cup 120-300    *Calcium content of tofu varies depending on processing method; check nutritional label on package for precise calcium content.     Suggested Guidelines for Calcium Supplement Use:  Calcium is absorbed most efficiently if taken in small amounts throughout the day.  Always divide the daily dose into smaller amounts if the total daily dose is 500mg  or more per day.  The body cannot use more than 500mg  Calcium at any one time. The use of manufactured supplements is encouraged.  Calcium as bone meal or dolomite may contain lead or other heavy metals as contaminants. Calcium supplements should not be taken with high fiber meals or with bulk forming laxatives. If calcium carbonate is used as the supplement form, it should be taken with meals to assure that stomach acid production is present to facilitate optimal dissolution and absorption of calcium.  This is important if atrophic gastritis with hypo- or achlorhydria is present, which it is in 20-50% of older individuals. It is important to drink plenty  of fluids while using the supplement to help reduce problems with side effects like constipation or bloating.  If these symptoms become a problem, switching to another form of supplement may be the answer. Another alternative is calcium-fortified foods, including fruit juices, cereals, and breads.  These foods are now marketed with added calcium and may be less likely to cause side effects. Those with personal or family  histories of kidney stones should be monitored to assure that hypercalcuria does not occur. CALCIUM INTAKE QUIZ  Dairy products are the primary source of calcium for most people.  For a quick estimate of your daily calcium intake, complete the following steps:  Use the chart below to determine your daily intake of calcium from diary foods. Servings of dairy per day 1 2 3 4 5 6 7 8   Milligrams (mg) of calcium: 250 [phone] 1250 1500 1750 2000   2.  Enter your total daily calcium intake from dairy foods:     _____mg  3.  Add 350 mg, which is the average for all other dietary sources:                 +            350 mg  4.  The sum of your total daily calcium intake:               ______mg  5.  Enter the recommended calcium intake for your age from the chart below;         ______mg  6.  Enter your daily intake from step 4 above and subtract:                             -        _______mg  7.  The result is how much additional calcium you need:                                          ______mg      Recommended Daily Calcium Intake  Population Calcium (mg)  Children 1-10 years [phone]  Children 11-24 years 33-1500  Men and women 25-64 At least 1200  Pregnant/Lactating At least 1200  Postmenopausal women with hormone replacement therapy At least 1200  Postmenopausal women without hormone replacement therapy At least 1200  Men and women 65+ At least [place] throw rugs and make certain carpet edges are securely fastened to the floor.  Reduce clutter, especially in traffic areas of the home.   Install/maintain sturdy handrails at stairs.  Increase wattage of lighting in hallways, bathrooms, kitchens, stairwells, and entrances to home.  Use night-lights near bed, in hallways, and in bathroom to improve night safety.  Install safety handrails in shower, tub, and around toilet.  Bathtubs and shower stalls should have  non-skid surfaces.  When you must reach for something high, use a safety step stool, one with wide steps and a friction surface to stand on.  A type equipped with a high handrail is preferred.  If a cane or other walking aid has been recommended, use it to help increase your stability.  Wear supportive, cushioned, low-heeled shoes.  Avoid "scuffs" (backless bedroom slippers) and high heels.  Avoid rushing to answer a phone,  doorbell, or anything else!  A portable phone that you can take from room to room with you is a good idea for security and safety.  Exercise regularly and stay active!!    Resources  National Osteoporosis [employer].NOF.org   Exercise for Osteoporosis; A Safe and Effective Way to Build Bone Density and Muscle Strength By: Myra Gianotti, M.A.                                               DO's and DON'T's  Avoid and/or Minimize positions of forward bending ( flexion) Side bending and rotation of the trunk Especially when movements occur together   When your back aches:  Don't sit down  Lie down on your back with a small pillow under your head and one under your knees or as outlined by our therapist. Or, lie in the 90/90 position ( on the floor with your feet and legs on the sofa with knees and hips bent to 90 degrees)  Tying or putting on your shoes:  Don't bend over to tie your shoes or put on socks. Instead, bring one foot up, cross it over the opposite knee and bend forward (hinge) at the hips to so the task.  Keep your back straight.  If you cannot do this safely, then you need to use long handled assistive devices such as a shoehorn and sock puller.  Exercising: Don't engage in ballistic types of exercise routines such as high-impact aerobics or jumping rope Don't do exercises in the gym that bring you forward (abdominal crunches, sit-ups, touching your  toes, knee-to-chest, straight leg raising.) Follow a regular exercise program that includes a  variety of different weight-bearing activities, such as low-impact aerobics, T' ai chi or walking as your physical therapist advises Do exercises that emphasize return to normal body alignment and strengthening of the muscles that keep your back straight, as outlined in this program or by your therapist  Household tasks: Don't reach unnecessarily or twist your trunk when mopping, sweeping, vacuuming, raking, making beds, weeding gardens, getting objects ou of cupboards, etc. Keep your broom, mop, vacuum, or rake close to you and mover your whole body as you move them. Walk over to the area on which you are working. Arrange kitchen, bathroom, and bedroom shelves so that frequently used items may be reached without excessive bending, twisting, and reaching.  Use a sturdy stool if necessary. Don't bend from the waist to pick up something up  Off the floor, out of the trunk of your car, or to brush your teeth, wash your face, etc.  Bend at the knees, keeping back straight as possible. Use a reacher if necessary.   Prevention of fracture is the so-called "BOTTOm -Line" in the management of OSTEOPOROSIS. Do not take unnecessary chances in movement. Once a compression fracture occurs, the process is very difficult to control; one fracture is frequently followed by many more.

## 2022-05-25 NOTE — Therapy (Signed)
OUTPATIENT PHYSICAL THERAPY THORACOLUMBAR EVALUATION   Patient Name: Pamela Ponce MRN: [id number] DOB:December 20, 1955, 67 y.o., female Today's Date: 05/25/2022  END OF SESSION:  PT End of Session - 05/25/22 0926     Visit Number 2    Date for PT Re-Evaluation 06/22/22    Authorization Type [employer] MEDICARE    Progress Note Due on Visit 10    PT Start Time 0926    PT Stop Time 1005    PT Time Calculation (min) 39 min    Activity Tolerance Patient tolerated treatment well    Behavior During Therapy WFL for tasks assessed/performed             Past Medical History:  Diagnosis Date   Hyperlipidemia    Hypertension    Past Surgical History:  Procedure Laterality Date   CESAREAN SECTION     x 2   CHOLECYSTECTOMY     PACEMAKER IMPLANT N/A 10/14/2021   Procedure: PACEMAKER IMPLANT;  Surgeon: Constance Haw, MD;  Location: [facility];  Service: Cardiovascular;  Laterality: N/A;   Patient Active Problem List   Diagnosis Date Noted   AV block, [place]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   SUBJECTIVE:                                                                                                                                                                                           SUBJECTIVE STATEMENT: Pt reports that she has been doing her exercise and denies any pain.  PERTINENT HISTORY:  HTN, pacemaker   PAIN:  Are you having pain? Yes: NPRS scale: 0/10 Pain location: L flank  Pain description: Achy pain Aggravating factors: Getting up after being sedentary  Relieving factors: Tyenol,   PRECAUTIONS: ICD/Pacemaker and Other: Osteoporosis   WEIGHT BEARING RESTRICTIONS:  No  FALLS:  Has patient fallen in last 6 months? No  LIVING ENVIRONMENT: Lives with: lives with their family Lives in: House/apartment Stairs: Yes: External: 3-4 steps; none Has following equipment at home: None  OCCUPATION: Retired   PLOF: Independent  PATIENT GOALS: Pt would like to start incorporating physical activity into her daily life to reduce continued bone density loss.    NEXT MD VISIT:   OBJECTIVE:   DIAGNOSTIC FINDINGS:  None recently    COGNITION: Overall cognitive status: Within functional limits for tasks  assessed     SENSATION: WFL  POSTURE: No Significant postural limitations   LUMBAR ROM:   AROM eval  Flexion WFL  Extension 85%   Right lateral flexion WFL  Left lateral flexion WFL  Right rotation WFL  Left rotation WFL   (Blank rows = not tested)  LOWER EXTREMITY ROM:     Active  Right eval Left eval  Hip flexion [facility]  Knee flexion [facility] WFL  Knee extension WFL WFL   (Blank rows = not tested)  LOWER EXTREMITY MMT:    MMT Right eval Left eval  Hip flexion [facility]  Knee flexion [facility] WFL  Knee extension WFL WFL   (Blank rows = not tested)  FUNCTIONAL TESTS:  Eval:  5 times sit to stand: 9.25 sec   05/25/2022: 3 Minute Walk Test:  650 ft without assistive device  GAIT: Distance walked: 27ft Assistive device utilized: None Level of assistance: Complete Independence Comments: None  TODAY'S TREATMENT:                                                                                                                               DATE:  05/25/2022 Nustep level 3 x6 min with PT present to discuss status Osteo Handout:  Decompression pose x1 min, Cervical retraction x10, shoulder press x10, leg lengthener x10 Hooklying transversus abdominus contraction:  2x10 Hooklying TA contraction with marching 2x10 Supine bent knee fallout x10 Supine bridging 2x10 (with cuing to push down through heels to decrease strain on her back) Review  of log rolling and practice x3 Sit to stand 2x10 3 Min Walk:  650 ft Tandem gait with occasional UE support of barre x3 laps down and back   DATE: [employer], reviewing, and completing below HEP    PATIENT EDUCATION:  Education details: Educated pt on anatomy and physiology of current symptoms,  diagnosis, prognosis, HEP,  and POC. Person educated: Patient Education method: Customer service manager Education comprehension: verbalized understanding and returned demonstration  HOME EXERCISE PROGRAM: Access Code: [email or web address] URL: [email or web address] Date: 05/18/2022 Prepared by: Rudi Heap  Exercises - Supine Lower Trunk Rotation  - 1 x daily - 7 x weekly - 2 sets - 10 reps - Supine Single Knee to Chest Stretch  - 1 x daily - 7 x weekly - 2 sets - 10 reps - Supine Bridge  - 1 x daily - 7 x weekly - 2 sets - 10 reps - Supine Posterior Pelvic Tilt  - 1 x daily - 7 x weekly - 2 sets - 10 reps - Bent Knee Fallouts  - 1 x daily - 7 x weekly - 3 sets - 10 reps  On 05/25/2022:  Provided with decompression exercises handout  ASSESSMENT:  CLINICAL IMPRESSION: Ms Cryan presents to skilled PT for first visit following initial evaluation.  Patient reports compliance with HEP.  Patient had not yet been provided with decompression exercises, so issued these during PT  session today.  Patient reported that these felt good and did not increase pain.  Patient was able to progress with core strengthening exercises, but she did report some pain in her knees with sit to stand exercises.  Patient continues to require skilled PT to progress towards goal related activities.   OBJECTIVE IMPAIRMENTS: cardiopulmonary status limiting activity, decreased activity tolerance, decreased balance, decreased endurance, decreased knowledge of condition, decreased knowledge of use of DME, decreased mobility, decreased ROM, decreased strength, decreased safety awareness, increased muscle spasms,  impaired flexibility, improper body mechanics, obesity, and pain.   ACTIVITY LIMITATIONS: carrying, lifting, bending, squatting, sleeping, transfers, dressing, reach over head, locomotion level, and caring for others  PARTICIPATION LIMITATIONS: meal prep, cleaning, laundry, interpersonal relationship, shopping, community activity, and yard work  PERSONAL FACTORS: Age, Behavior pattern, Education, Fitness, Past/current experiences, Sex, Time since onset of injury/illness/exacerbation, and 1-2 comorbidities: HTN, pacemaker   are also affecting patient's functional outcome.   REHAB POTENTIAL: Good  CLINICAL DECISION MAKING: Stable/uncomplicated  EVALUATION COMPLEXITY: Low   GOALS: Goals reviewed with patient? No Target Date:  06/15/2022  Patient will verbally understand ways to manage osteoporosis with diet and correct to reduce strain postures on spine. Status:  ONGOING   Patient will verbally understand correct body mechanics for home and work tasks to decrease strain on spine. Status:  NEW  Patient will verbally understand ways to strengthen postural musculature. Status:  NEW  Initiate HEP focusing on managing osteoporosis postural deficits and strength deficits. Status:  NEW  Patient can verbally understand the dos and don'ts of osteoporosis management. Status:  ONGOING  PLAN:  PT FREQUENCY: 1-2x/week  PT DURATION: 4 weeks  PLANNED INTERVENTIONS: Therapeutic exercises, Therapeutic activity, Neuromuscular re-education, Balance training, Gait training, Patient/Family education, Self Care, Joint mobilization, Joint manipulation, Stair training, Vestibular training, Aquatic Therapy, Dry Needling, Electrical stimulation, Spinal manipulation, Spinal mobilization, Cryotherapy, Moist heat, Taping, Vasopneumatic device, Traction, Ultrasound, Manual therapy, and Re-evaluation.  PLAN FOR NEXT SESSION: Review and Progress HEP.    Juel Burrow, PT 05/25/2022, 10:22 AM  [facility] [address] [place], [place] [address] Phone # [phone] Fax [phone]

## 2022-05-31 NOTE — Therapy (Unsigned)
OUTPATIENT PHYSICAL THERAPY THORACOLUMBAR EVALUATION   Patient Name: Pamela Ponce MRN: [id number] DOB:1956/04/19, 67 y.o., female Today's Date: 06/01/2022  END OF SESSION:  PT End of Session - 06/01/22 0959     Visit Number 34    Number of Visits 5    Date for PT Re-Evaluation 06/22/22    Authorization Type [employer] MEDICARE    Progress Note Due on Visit 10    PT Start Time 0931    PT Stop Time 1010    PT Time Calculation (min) 39 min    Activity Tolerance Patient tolerated treatment well    Behavior During Therapy WFL for tasks assessed/performed              Past Medical History:  Diagnosis Date   Hyperlipidemia    Hypertension    Past Surgical History:  Procedure Laterality Date   CESAREAN SECTION     x 2   CHOLECYSTECTOMY     PACEMAKER IMPLANT N/A 10/14/2021   Procedure: PACEMAKER IMPLANT;  Surgeon: Constance Haw, MD;  Location: [place] [facility];  Service: Cardiovascular;  Laterality: N/A;   Patient Active Problem List   Diagnosis Date Noted   AV block, Mobitz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   SUBJECTIVE:                                                                                                                                                                                           SUBJECTIVE STATEMENT: Pt reports compliance and relief with current HEP.   PERTINENT HISTORY:  HTN, pacemaker   PAIN:  Are you having pain? Yes: NPRS scale: 0/10 Pain location: L flank  Pain description: Achy pain Aggravating factors: Getting up after being sedentary  Relieving factors: Tyenol,   PRECAUTIONS: ICD/Pacemaker and Other: Osteoporosis   WEIGHT BEARING RESTRICTIONS:  No  FALLS:  Has patient fallen in last 6 months? No  LIVING ENVIRONMENT: Lives with: lives with their family Lives in: House/apartment Stairs: Yes: External: 3-4 steps; none Has following equipment at home: None  OCCUPATION: Retired   PLOF: Independent  PATIENT GOALS: Pt would like to start incorporating physical activity into her daily life to reduce continued bone density loss.    NEXT MD VISIT:   OBJECTIVE:   DIAGNOSTIC FINDINGS:  None recently    COGNITION: Overall cognitive status: Within  functional limits for tasks assessed     SENSATION: WFL  POSTURE: No Significant postural limitations   LUMBAR ROM:   AROM eval  Flexion WFL  Extension 85%   Right lateral flexion WFL  Left lateral flexion WFL  Right rotation WFL  Left rotation WFL   (Blank rows = not tested)  LOWER EXTREMITY ROM:     Active  Right eval Left eval  Hip flexion [facility]  Knee flexion [facility] WFL  Knee extension WFL WFL   (Blank rows = not tested)  LOWER EXTREMITY MMT:    MMT Right eval Left eval  Hip flexion [facility]  Knee flexion [facility] WFL  Knee extension WFL WFL   (Blank rows = not tested)  FUNCTIONAL TESTS:  Eval:  5 times sit to stand: 9.25 sec   05/25/2022: 3 Minute Walk Test:  650 ft without assistive device  GAIT: Distance walked: 27f Assistive device utilized: None Level of assistance: Complete Independence Comments: None  TODAY'S TREATMENT:      Date: 06/01/2022 Nustep level 5 x6 min with PT present to discuss status LTR x20 - to help minimize onset of LBP when it comes on with scrubbing floors.  Hooklying transversus abdominus contraction:  2x10 Hooklying TA contraction with marching 2x10 Supine bent knee fallout 2x10 with Green theraband  Supine bridging with green theraband 2x10 (with cuing to push down through heels to decrease strain on her back) Review of log rolling and practice x1 Squats to chair at bar 2x10 Tandem gait with occasional UE support of  barre x3 laps down and back     Side stepping at bar with green theraband around knees 6 laps.       Heel raises 2x10       Lateral step up 6" x15, bilat                                                                                                                 DATE:  05/25/2022 Nustep level 3 x6 min with PT present to discuss status LTR x20 - to help minimize onset of LBP when it comes on with scrubbing floors.  Osteo Handout:  Decompression pose x1 min, Cervical retraction x10, shoulder press x10, leg lengthener x10 Hooklying transversus abdominus contraction:  2x10 Hooklying TA contraction with marching 2x10 Supine bent knee fallout x10 Supine bridging 2x10 (with cuing to push down through heels to decrease strain on her back) Review of log rolling and practice x3 Sit to stand 2x10 3 Min Walk:  650 ft Tandem gait with occasional UE support of barre x3 laps down and back   DATE: [employer] reviewing, and completing below HEP    PATIENT EDUCATION:  Education details: Educated pt on anatomy and physiology of current symptoms,  diagnosis, prognosis, HEP,  and POC. Person educated: Patient Education method: ECustomer service managerEducation comprehension: verbalized understanding and returned demonstration  HOME EXERCISE PROGRAM: Access Code: [email or web address] URL: [email or web address] Date: 05/18/2022 Prepared by: SRudi Heap Exercises - Supine  Lower Trunk Rotation  - 1 x daily - 7 x weekly - 2 sets - 10 reps - Supine Single Knee to Chest Stretch  - 1 x daily - 7 x weekly - 2 sets - 10 reps - Supine Bridge  - 1 x daily - 7 x weekly - 2 sets - 10 reps - Supine Posterior Pelvic Tilt  - 1 x daily - 7 x weekly - 2 sets - 10 reps - Bent Knee Fallouts  - 1 x daily - 7 x weekly - 3 sets - 10 reps  On 05/25/2022:  Provided with decompression exercises handout  ASSESSMENT:  CLINICAL IMPRESSION: Ms Modzelewski presents to skilled PT with reports of compliance with her  HEP and noted improvements. Patient was able to progress with core, and proximal hip strengthening exercises with no reports of pain, but fatigue. Due to reports of lower back pain with kneeling postures when kneeling, encouraged lower trunk mobility exercises.  Patient continues to require skilled PT to progress towards goal related activities.   OBJECTIVE IMPAIRMENTS: cardiopulmonary status limiting activity, decreased activity tolerance, decreased balance, decreased endurance, decreased knowledge of condition, decreased knowledge of use of DME, decreased mobility, decreased ROM, decreased strength, decreased safety awareness, increased muscle spasms, impaired flexibility, improper body mechanics, obesity, and pain.   ACTIVITY LIMITATIONS: carrying, lifting, bending, squatting, sleeping, transfers, dressing, reach over head, locomotion level, and caring for others  PARTICIPATION LIMITATIONS: meal prep, cleaning, laundry, interpersonal relationship, shopping, community activity, and yard work  PERSONAL FACTORS: Age, Behavior pattern, Education, Fitness, Past/current experiences, Sex, Time since onset of injury/illness/exacerbation, and 1-2 comorbidities: HTN, pacemaker   are also affecting patient's functional outcome.   REHAB POTENTIAL: Good  CLINICAL DECISION MAKING: Stable/uncomplicated  EVALUATION COMPLEXITY: Low   GOALS: Goals reviewed with patient? No Target Date:  06/15/2022  Patient will verbally understand ways to manage osteoporosis with diet and correct to reduce strain postures on spine. Status:  ONGOING   Patient will verbally understand correct body mechanics for home and work tasks to decrease strain on spine. Status:  NEW  Patient will verbally understand ways to strengthen postural musculature. Status:  NEW  Initiate HEP focusing on managing osteoporosis postural deficits and strength deficits. Status:  NEW  Patient can verbally understand the dos and don'ts of  osteoporosis management. Status:  ONGOING  PLAN:  PT FREQUENCY: 1-2x/week  PT DURATION: 4 weeks  PLANNED INTERVENTIONS: Therapeutic exercises, Therapeutic activity, Neuromuscular re-education, Balance training, Gait training, Patient/Family education, Self Care, Joint mobilization, Joint manipulation, Stair training, Vestibular training, Aquatic Therapy, Dry Needling, Electrical stimulation, Spinal manipulation, Spinal mobilization, Cryotherapy, Moist heat, Taping, Vasopneumatic device, Traction, Ultrasound, Manual therapy, and Re-evaluation.  PLAN FOR NEXT SESSION: Review and Progress HEP.    Rudi Heap PT, DPT 06/01/22  10:22 AM

## 2022-06-01 ENCOUNTER — Encounter: Payer: Self-pay | Admitting: Physical Therapy

## 2022-06-01 ENCOUNTER — Ambulatory Visit: Payer: Medicare Other | Admitting: Physical Therapy

## 2022-06-01 DIAGNOSIS — M6281 Muscle weakness (generalized): Secondary | ICD-10-CM

## 2022-06-01 DIAGNOSIS — M5459 Other low back pain: Secondary | ICD-10-CM

## 2022-06-08 ENCOUNTER — Ambulatory Visit: Payer: Medicare Other

## 2022-06-08 DIAGNOSIS — M5459 Other low back pain: Secondary | ICD-10-CM | POA: Diagnosis not present

## 2022-06-08 DIAGNOSIS — M6281 Muscle weakness (generalized): Secondary | ICD-10-CM | POA: Diagnosis not present

## 2022-06-08 NOTE — Patient Instructions (Signed)
   Lifting Principles  Maintain proper posture and head alignment. Slide object as close as possible before lifting. Move obstacles out of the way. Test before lifting; ask for help if too heavy. Tighten stomach muscles without holding breath. Use smooth movements; do not jerk. Use legs to do the work, and pivot with feet. Distribute the work load symmetrically and close to the center of trunk. Push instead of pull whenever possible.   Squat down and hold basket close to stand. Use leg muscles to do the work.    Avoid twisting or bending back. Pivot around using foot movements, and bend at knees if needed when reaching for articles.        Getting Into / Out of Bed   Lower self to lie down on one side by raising legs and lowering head at the same time. Use arms to assist moving without twisting. Bend both knees to roll onto back if desired. To sit up, start from lying on side, and use same move-ments in reverse. Keep trunk aligned with legs.    Shift weight from front foot to back foot as item is lifted off shelf.    When leaning forward to pick object up from floor, extend one leg out behind. Keep back straight. Hold onto a sturdy support with other hand.      Sit upright, head facing forward. Try using a roll to support lower back. Keep shoulders relaxed, and avoid rounded back. Keep hips level with knees. Avoid crossing legs for long periods.     [facility] [address] [place], [place] [address] Phone # [phone] Fax [phone]

## 2022-06-08 NOTE — Therapy (Signed)
OUTPATIENT PHYSICAL THERAPY THORACOLUMBAR EVALUATION   Patient Name: Pamela Ponce MRN: [id number] DOB:1955/04/26, 67 y.o., female Today's Date: 06/08/2022  END OF SESSION:  PT End of Session - 06/08/22 1011     Visit Number 4    Date for PT Re-Evaluation 06/22/22    Authorization Type [employer] MEDICARE    Progress Note Due on Visit 10    PT Start Time 0932    PT Stop Time 1012    PT Time Calculation (min) 40 min    Activity Tolerance Patient tolerated treatment well    Behavior During Therapy WFL for tasks assessed/performed               Past Medical History:  Diagnosis Date   Hyperlipidemia    Hypertension    Past Surgical History:  Procedure Laterality Date   CESAREAN SECTION     x 2   CHOLECYSTECTOMY     PACEMAKER IMPLANT N/A 10/14/2021   Procedure: PACEMAKER IMPLANT;  Surgeon: Constance Haw, MD;  Location: [place] [facility];  Service: Cardiovascular;  Laterality: N/A;   Patient Active Problem List   Diagnosis Date Noted   AV block, Mobitz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   SUBJECTIVE:                                                                                                                                                                                           SUBJECTIVE STATEMENT: Pt reports compliance and relief with current HEP.   PERTINENT HISTORY:  HTN, pacemaker   PAIN:  Are you having pain? Yes: NPRS scale: 0/10 Pain location: L flank  Pain description: Achy pain Aggravating factors: Getting up after being sedentary  Relieving factors: Tyenol,   PRECAUTIONS: ICD/Pacemaker and Other: Osteoporosis   WEIGHT BEARING RESTRICTIONS: No  FALLS:  Has  patient fallen in last 6 months? No  LIVING ENVIRONMENT: Lives with: lives with their family Lives in: House/apartment Stairs: Yes: External: 3-4 steps; none Has following equipment at home: None  OCCUPATION: Retired   PLOF: Independent  PATIENT GOALS: Pt would like to start incorporating physical activity into her daily life to reduce continued bone density loss.    NEXT MD VISIT:   OBJECTIVE:   DIAGNOSTIC FINDINGS:  None recently    COGNITION: Overall cognitive status: Within functional limits for tasks assessed  SENSATION: WFL  POSTURE: No Significant postural limitations   LUMBAR ROM:   AROM eval  Flexion WFL  Extension 85%   Right lateral flexion WFL  Left lateral flexion WFL  Right rotation WFL  Left rotation WFL   (Blank rows = not tested)  LOWER EXTREMITY ROM:     Active  Right eval Left eval  Hip flexion [facility]  Knee flexion [facility] WFL  Knee extension WFL WFL   (Blank rows = not tested)  LOWER EXTREMITY MMT:    MMT Right eval Left eval  Hip flexion [facility]  Knee flexion [facility] WFL  Knee extension WFL WFL   (Blank rows = not tested)  FUNCTIONAL TESTS:  Eval:  5 times sit to stand: 9.25 sec   05/25/2022: 3 Minute Walk Test:  650 ft without assistive device  GAIT: Distance walked: 42f Assistive device utilized: None Level of assistance: Complete Independence Comments: None  TODAY'S TREATMENT:      Date: 06/08/2022 Nustep level 5 x6 min with PT present to discuss status Sit to stand: 2x10 without UE support Supine decompression exercises: decompression position, shoulder press, leg lengthener Hooklying TA contraction with marching 2x10 Supine bent knee fallout 2x10 with Green theraband  Supine bridging with green theraband 2x10 (with cuing to push down through heels to decrease strain on her back) Tandem gait with occasional UE support of barre x3 laps down and back         Heel raises 2x10  Standing hip abduction at barre: 2x10  and TA engagement       Date: 06/01/2022 Nustep level 5 x6 min with PT present to discuss status LTR x20 - to help minimize onset of LBP when it comes on with scrubbing floors.  Hooklying transversus abdominus contraction:  2x10 Hooklying TA contraction with marching 2x10 Supine bent knee fallout 2x10 with Green theraband  Supine bridging with green theraband 2x10 (with cuing to push down through heels to decrease strain on her back) Review of log rolling and practice x1 Squats to chair at bar 2x10 Tandem gait with occasional UE support of barre x3 laps down and back     Side stepping at bar with green theraband around knees 6 laps.       Heel raises 2x10       Lateral step up 6" x15, bilat                                                                                                                 DATE:  05/25/2022 Nustep level 3 x6 min with PT present to discuss status LTR x20 - to help minimize onset of LBP when it comes on with scrubbing floors.  Osteo Handout:  Decompression pose x1 min, Cervical retraction x10, shoulder press x10, leg lengthener x10 Hooklying transversus abdominus contraction:  2x10 Hooklying TA contraction with marching 2x10 Supine bent knee fallout x10 Supine bridging 2x10 (with cuing to push down through heels to decrease strain on her back) Review of log rolling  and practice x3 Sit to stand 2x10 3 Min Walk:  650 ft Tandem gait with occasional UE support of barre x3 laps down and back    PATIENT EDUCATION:  Education details: Emergency planning/management officer, [email or web address] Person educated: Patient Education method: Customer service manager Education comprehension: verbalized understanding and returned demonstration  HOME EXERCISE PROGRAM: Access Code: [email or web address] URL: [email or web address] Date: 06/08/2022 Prepared by: Claiborne Billings  Exercises - Supine Lower Trunk Rotation  - 1 x daily - 7 x weekly - 2 sets - 10 reps - Supine Single Knee to Chest  Stretch  - 1 x daily - 7 x weekly - 2 sets - 10 reps - Supine Bridge  - 1 x daily - 7 x weekly - 2 sets - 10 reps - Supine Posterior Pelvic Tilt  - 1 x daily - 7 x weekly - 2 sets - 10 reps - Bent Knee Fallouts  - 1 x daily - 7 x weekly - 3 sets - 10 reps - Heel Raises with Counter Support  - 2 x daily - 7 x weekly - 2 sets - 10 reps - Walking Tandem Stance  - 2 x daily - 7 x weekly - 1 sets - 10 reps - Sit to Stand  - 1 x daily - 7 x weekly - 3 sets - 10 reps - Standing Hip Abduction with Counter Support  - 2 x daily - 7 x weekly - 2 sets - 10 reps  On 05/25/2022:  Provided with decompression exercises handout  ASSESSMENT:  CLINICAL IMPRESSION: Pt has received instructional handouts regarding osteoporosis and do/don't activities to reduce risk of fracture and improve bone density. Pt reports that she is making postural and mechanics modifications at home.   PT added standing exercises to HEP and provided education regarding how this will help her lumbar spine and bone density.   Patient continues to require skilled PT to progress towards goal related activities.   OBJECTIVE IMPAIRMENTS: cardiopulmonary status limiting activity, decreased activity tolerance, decreased balance, decreased endurance, decreased knowledge of condition, decreased knowledge of use of DME, decreased mobility, decreased ROM, decreased strength, decreased safety awareness, increased muscle spasms, impaired flexibility, improper body mechanics, obesity, and pain.   ACTIVITY LIMITATIONS: carrying, lifting, bending, squatting, sleeping, transfers, dressing, reach over head, locomotion level, and caring for others  PARTICIPATION LIMITATIONS: meal prep, cleaning, laundry, interpersonal relationship, shopping, community activity, and yard work  PERSONAL FACTORS: Age, Behavior pattern, Education, Fitness, Past/current experiences, Sex, Time since onset of injury/illness/exacerbation, and 1-2 comorbidities: HTN, pacemaker   are  also affecting patient's functional outcome.   REHAB POTENTIAL: Good  CLINICAL DECISION MAKING: Stable/uncomplicated  EVALUATION COMPLEXITY: Low   GOALS: Goals reviewed with patient? No Target Date:  06/15/2022  Patient will verbally understand ways to manage osteoporosis with diet and correct to reduce strain postures on spine. Status:  ONGOING   Patient will verbally understand correct body mechanics for home and work tasks to decrease strain on spine. Status:  NEW  Patient will verbally understand ways to strengthen postural musculature. Status:  In progress (06/08/22)  Initiate HEP focusing on managing osteoporosis postural deficits and strength deficits. Status:  NEW  Patient can verbally understand the dos and don'ts of osteoporosis management. Status:  ONGOING  PLAN:  PT FREQUENCY: 1-2x/week  PT DURATION: 4 weeks  PLANNED INTERVENTIONS: Therapeutic exercises, Therapeutic activity, Neuromuscular re-education, Balance training, Gait training, Patient/Family education, Self Care, Joint mobilization, Joint manipulation, Stair training, Vestibular training, Aquatic Therapy, Dry Needling, Electrical stimulation, Spinal manipulation,  Spinal mobilization, Cryotherapy, Moist heat, Taping, Vasopneumatic device, Traction, Ultrasound, Manual therapy, and Re-evaluation.  PLAN FOR NEXT SESSION: Strength, balance tasks    Sigurd Sos, [place] 06/08/22 10:12 AM   [facility] [address] [place], [place] [address] Phone # [phone] Fax [phone]

## 2022-06-15 ENCOUNTER — Ambulatory Visit: Payer: Medicare Other

## 2022-06-15 DIAGNOSIS — M5459 Other low back pain: Secondary | ICD-10-CM | POA: Diagnosis not present

## 2022-06-15 DIAGNOSIS — M6281 Muscle weakness (generalized): Secondary | ICD-10-CM | POA: Diagnosis not present

## 2022-06-15 NOTE — Therapy (Signed)
OUTPATIENT PHYSICAL THERAPY THORACOLUMBAR EVALUATION   Patient Name: Pamela Ponce MRN: [id number] DOB:01-11-1956, 67 y.o., female Today's Date: 06/15/2022  END OF SESSION:  PT End of Session - 06/15/22 0951     Visit Number 5    Authorization Type [employer] MEDICARE    PT Start Time [phone]    PT Stop Time 0959    PT Time Calculation (min) 28 min    Activity Tolerance Patient tolerated treatment well    Behavior During Therapy WFL for tasks assessed/performed                Past Medical History:  Diagnosis Date   Hyperlipidemia    Hypertension    Past Surgical History:  Procedure Laterality Date   CESAREAN SECTION     x 2   CHOLECYSTECTOMY     PACEMAKER IMPLANT N/A 10/14/2021   Procedure: PACEMAKER IMPLANT;  Surgeon: Constance Haw, MD;  Location: [facility];  Service: Cardiovascular;  Laterality: N/A;   Patient Active Problem List   Diagnosis Date Noted   AV block, [place]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   SUBJECTIVE:                                                                                                                                                                                           SUBJECTIVE STATEMENT: I am doing well with the exercises.  I am ready to D/C to HEP.   PERTINENT HISTORY:  HTN, pacemaker   PAIN:  Are you having pain? Yes: NPRS scale: 0/10 Pain location: L flank  Pain description: Achy pain Aggravating factors: Getting up after being sedentary  Relieving factors: Tyenol,   PRECAUTIONS: ICD/Pacemaker and Other: Osteoporosis   WEIGHT BEARING RESTRICTIONS: No  FALLS:  Has patient fallen in last 6 months? No  LIVING  ENVIRONMENT: Lives with: lives with their family Lives in: House/apartment Stairs: Yes: External: 3-4 steps; none Has following equipment at home: None  OCCUPATION: Retired   PLOF: Independent  PATIENT GOALS: Pt would like to start incorporating physical activity into her daily life to reduce continued bone density loss.    NEXT MD VISIT:   OBJECTIVE:   DIAGNOSTIC FINDINGS:  None recently    COGNITION: Overall cognitive status: Within functional limits for tasks assessed     SENSATION: WFL  POSTURE: No Significant  postural limitations   LUMBAR ROM:   AROM eval  Flexion WFL  Extension 85%   Right lateral flexion WFL  Left lateral flexion WFL  Right rotation WFL  Left rotation WFL   (Blank rows = not tested)  LOWER EXTREMITY ROM:     Active  Right eval Left eval  Hip flexion [facility]  Knee flexion [facility] WFL  Knee extension WFL WFL   (Blank rows = not tested)  LOWER EXTREMITY MMT:    MMT Right eval Left eval  Hip flexion [facility]  Knee flexion [facility] WFL  Knee extension WFL WFL   (Blank rows = not tested)  FUNCTIONAL TESTS:  Eval:  5 times sit to stand: 9.25 sec   05/25/2022: 3 Minute Walk Test:  650 ft without assistive device  GAIT: Distance walked: 50f Assistive device utilized: None Level of assistance: Complete Independence Comments: None  TODAY'S TREATMENT:      Date: 06/15/2022 Nustep level 5 x6 min with PT present to discuss status Sit to stand: 2x10 without UE support Hooklying TA contraction with marching 2x10 Supine bent knee fallout 2x10 with Green theraband  Supine bridging with green theraband 2x10 (with cuing to push down through heels to decrease strain on her back) Tandem gait with occasional UE support of barre x3 laps down and back         Heel raises 2x10  Standing hip abduction at barre: 2x10 and TA engagement    Date: 06/08/2022 Nustep level 5 x6 min with PT present to discuss status Sit to stand: 2x10 without UE  support Supine decompression exercises: decompression position, shoulder press, leg lengthener Hooklying TA contraction with marching 2x10 Supine bent knee fallout 2x10 with Green theraband  Supine bridging with green theraband 2x10 (with cuing to push down through heels to decrease strain on her back) Tandem gait with occasional UE support of barre x3 laps down and back         Heel raises 2x10  Standing hip abduction at barre: 2x10 and TA engagement       Date: 06/01/2022 Nustep level 5 x6 min with PT present to discuss status LTR x20 - to help minimize onset of LBP when it comes on with scrubbing floors.  Hooklying transversus abdominus contraction:  2x10 Hooklying TA contraction with marching 2x10 Supine bent knee fallout 2x10 with Green theraband  Supine bridging with green theraband 2x10 (with cuing to push down through heels to decrease strain on her back) Review of log rolling and practice x1 Squats to chair at bar 2x10 Tandem gait with occasional UE support of barre x3 laps down and back     Side stepping at bar with green theraband around knees 6 laps.       Heel raises 2x10       Lateral step up 6" x15, bilat  PATIENT EDUCATION:  Education details: Emergency planning/management officer, AXFHJAYY Person educated: Patient Education method: Customer service manager Education comprehension: verbalized understanding and returned demonstration  HOME EXERCISE PROGRAM: Access Code: [email or web address] URL: [email or web address] Date: 06/08/2022 Prepared by: Claiborne Billings  Exercises - Supine Lower Trunk Rotation  - 1 x daily - 7 x weekly - 2 sets - 10 reps - Supine Single Knee to Chest Stretch  - 1 x daily - 7 x weekly - 2 sets - 10 reps - Supine Bridge  - 1 x daily - 7 x weekly - 2 sets - 10 reps - Supine Posterior Pelvic Tilt  - 1 x daily - 7 x weekly - 2 sets - 10 reps - Bent  Knee Fallouts  - 1 x daily - 7 x weekly - 3 sets - 10 reps - Heel Raises with Counter Support  - 2 x daily - 7 x weekly - 2 sets - 10 reps - Walking Tandem Stance  - 2 x daily - 7 x weekly - 1 sets - 10 reps - Sit to Stand  - 1 x daily - 7 x weekly - 3 sets - 10 reps - Standing Hip Abduction with Counter Support  - 2 x daily - 7 x weekly - 2 sets - 10 reps  On 05/25/2022:  Provided with decompression exercises handout  ASSESSMENT:  CLINICAL IMPRESSION: Pt is ready for D/C. She has received education regarding osteoporosis, body mechanics modifications to reduce fracture risk and has HEP in place for postural and core strength and balance tasks.  Pt did all new HEP with good form today. PT discussed how to incorporate exercises into daily tasks.  PT will D/C to HEP today.    OBJECTIVE IMPAIRMENTS: cardiopulmonary status limiting activity, decreased activity tolerance, decreased balance, decreased endurance, decreased knowledge of condition, decreased knowledge of use of DME, decreased mobility, decreased ROM, decreased strength, decreased safety awareness, increased muscle spasms, impaired flexibility, improper body mechanics, obesity, and pain.   ACTIVITY LIMITATIONS: carrying, lifting, bending, squatting, sleeping, transfers, dressing, reach over head, locomotion level, and caring for others  PARTICIPATION LIMITATIONS: meal prep, cleaning, laundry, interpersonal relationship, shopping, community activity, and yard work  PERSONAL FACTORS: Age, Behavior pattern, Education, Fitness, Past/current experiences, Sex, Time since onset of injury/illness/exacerbation, and 1-2 comorbidities: HTN, pacemaker   are also affecting patient's functional outcome.   REHAB POTENTIAL: Good  CLINICAL DECISION MAKING: Stable/uncomplicated  EVALUATION COMPLEXITY: Low   GOALS: Goals reviewed with patient? No Target Date:  06/15/2022  Patient will verbally understand ways to manage osteoporosis with diet and  correct to reduce strain postures on spine. Status:  ONGOING   Patient will verbally understand correct body mechanics for home and work tasks to decrease strain on spine. Status:  NEW  Patient will verbally understand ways to strengthen postural musculature. Status:  MET  Initiate HEP focusing on managing osteoporosis postural deficits and strength deficits. Status:  NEW  Patient can verbally understand the dos and don'ts of osteoporosis management. Status:  MET  PLAN: D/C PT to HEP PHYSICAL THERAPY DISCHARGE SUMMARY  Visits from Start of Care: 5  Current functional level related to goals / functional outcomes: See above.  No functional deficits remain.    Remaining deficits: No deficits.    Education / Equipment: HEP, Osteoporosis education   Patient agrees to discharge. Patient goals were met. Patient is being discharged due to meeting the stated rehab goals.   Sigurd Sos, PT 06/15/22 10:00 AM   [facility]  [place], [address] [place], [place] [address] Phone # [phone] Fax [phone]

## 2022-07-16 ENCOUNTER — Ambulatory Visit (INDEPENDENT_AMBULATORY_CARE_PROVIDER_SITE_OTHER): Payer: Medicare Other

## 2022-07-16 DIAGNOSIS — I441 Atrioventricular block, second degree: Secondary | ICD-10-CM | POA: Diagnosis not present

## 2022-07-18 LAB — CUP PACEART REMOTE DEVICE CHECK
Date Time Interrogation Session: 20240328193041
Implantable Lead Connection Status: 753985
Implantable Lead Connection Status: 753985
Implantable Lead Implant Date: 20230630
Implantable Lead Implant Date: 20230630
Implantable Lead Location: 753859
Implantable Lead Location: 753860
Implantable Lead Model: 377
Implantable Lead Model: 377
Implantable Lead Serial Number: 8000862625
Implantable Lead Serial Number: 800921449
Implantable Pulse Generator Implant Date: 20230630
Pulse Gen Model: 407145
Pulse Gen Serial Number: 70444647

## 2022-08-18 NOTE — Progress Notes (Signed)
Remote pacemaker transmission.   

## 2022-08-27 DIAGNOSIS — K648 Other hemorrhoids: Secondary | ICD-10-CM | POA: Diagnosis not present

## 2022-08-27 DIAGNOSIS — D125 Benign neoplasm of sigmoid colon: Secondary | ICD-10-CM | POA: Diagnosis not present

## 2022-08-27 DIAGNOSIS — Z09 Encounter for follow-up examination after completed treatment for conditions other than malignant neoplasm: Secondary | ICD-10-CM | POA: Diagnosis not present

## 2022-08-27 DIAGNOSIS — K5730 Diverticulosis of large intestine without perforation or abscess without bleeding: Secondary | ICD-10-CM | POA: Diagnosis not present

## 2022-08-27 DIAGNOSIS — Z86010 Personal history of colonic polyps: Secondary | ICD-10-CM | POA: Diagnosis not present

## 2022-08-31 DIAGNOSIS — D125 Benign neoplasm of sigmoid colon: Secondary | ICD-10-CM | POA: Diagnosis not present

## 2022-09-28 DIAGNOSIS — M810 Age-related osteoporosis without current pathological fracture: Secondary | ICD-10-CM | POA: Diagnosis not present

## 2022-09-28 DIAGNOSIS — E559 Vitamin D deficiency, unspecified: Secondary | ICD-10-CM | POA: Diagnosis not present

## 2022-09-28 DIAGNOSIS — E782 Mixed hyperlipidemia: Secondary | ICD-10-CM | POA: Diagnosis not present

## 2022-09-28 DIAGNOSIS — Z87898 Personal history of other specified conditions: Secondary | ICD-10-CM | POA: Diagnosis not present

## 2022-09-28 DIAGNOSIS — G4700 Insomnia, unspecified: Secondary | ICD-10-CM | POA: Diagnosis not present

## 2022-09-28 DIAGNOSIS — I10 Essential (primary) hypertension: Secondary | ICD-10-CM | POA: Diagnosis not present

## 2022-10-15 ENCOUNTER — Ambulatory Visit (INDEPENDENT_AMBULATORY_CARE_PROVIDER_SITE_OTHER): Payer: Medicare Other

## 2022-10-15 DIAGNOSIS — I441 Atrioventricular block, second degree: Secondary | ICD-10-CM | POA: Diagnosis not present

## 2022-10-15 IMAGING — MG MM DIGITAL SCREENING BILAT W/ TOMO AND CAD
6 of 10 series · 6 of 30 positions shown · non-contrast
Comparison: Previous exam(s).

CLINICAL DATA: Screening.

EXAM:
DIGITAL SCREENING BILATERAL MAMMOGRAM WITH TOMOSYNTHESIS AND CAD
TECHNIQUE: Bilateral screening digital craniocaudal and mediolateral oblique
mammograms were obtained. Bilateral screening digital breast
tomosynthesis was performed. The images were evaluated with
computer-aided detection.

[R MLO synth-2D (1 of 2)]
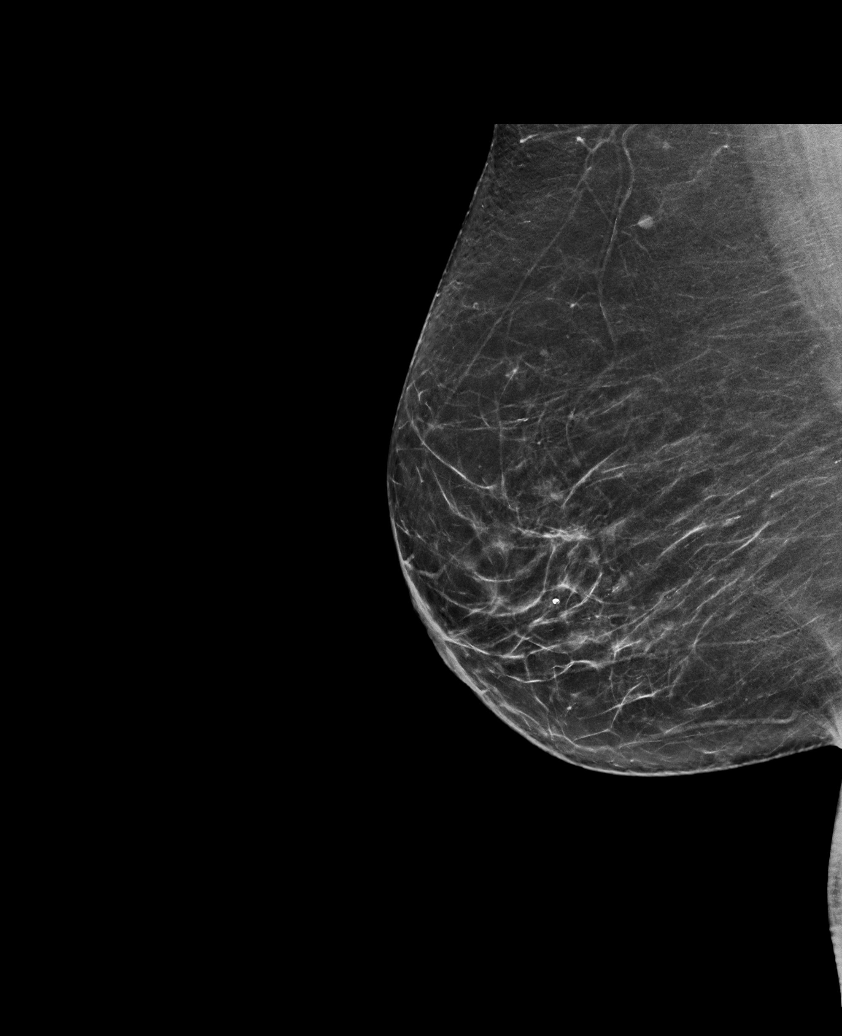

[L CC synth-2D]
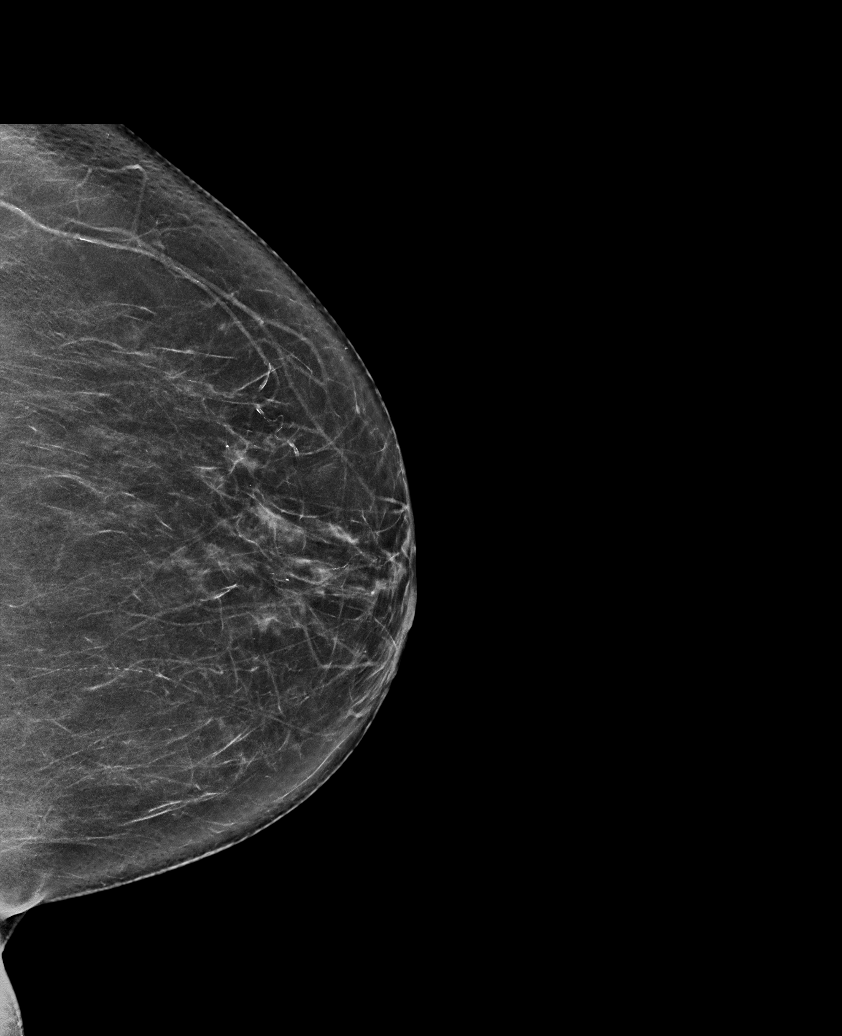

[R MLO synth-2D (2 of 2)]
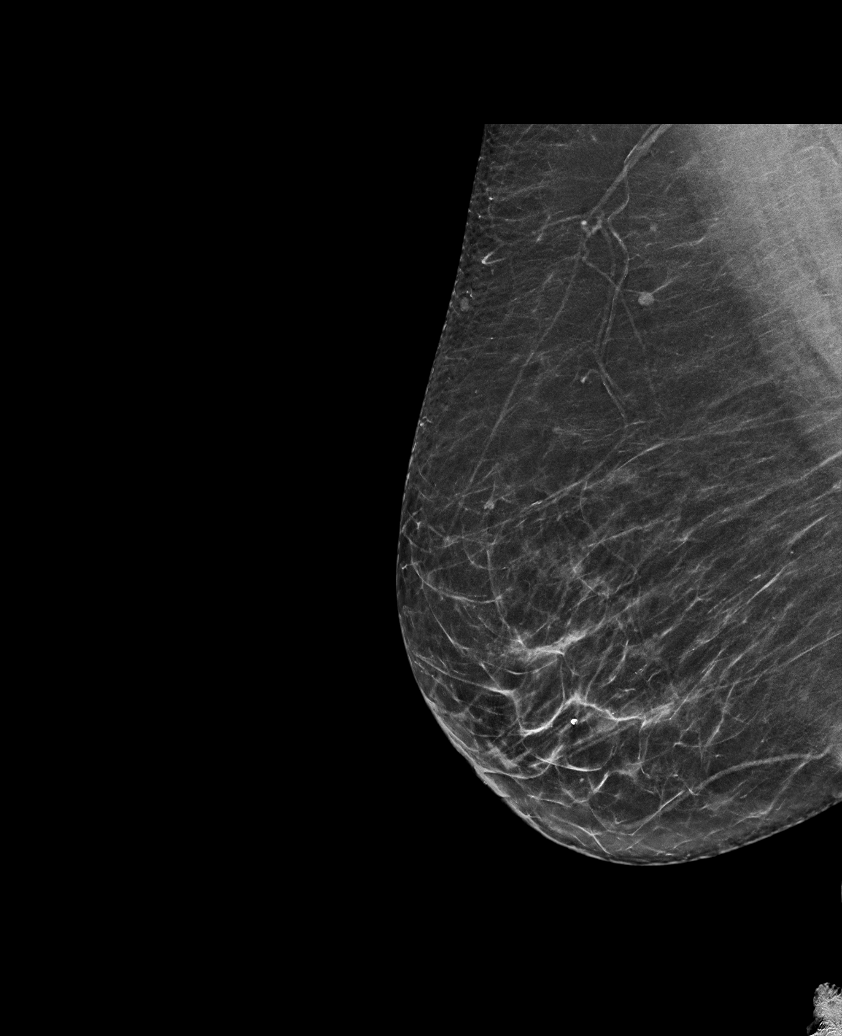

[R CC synth-2D]
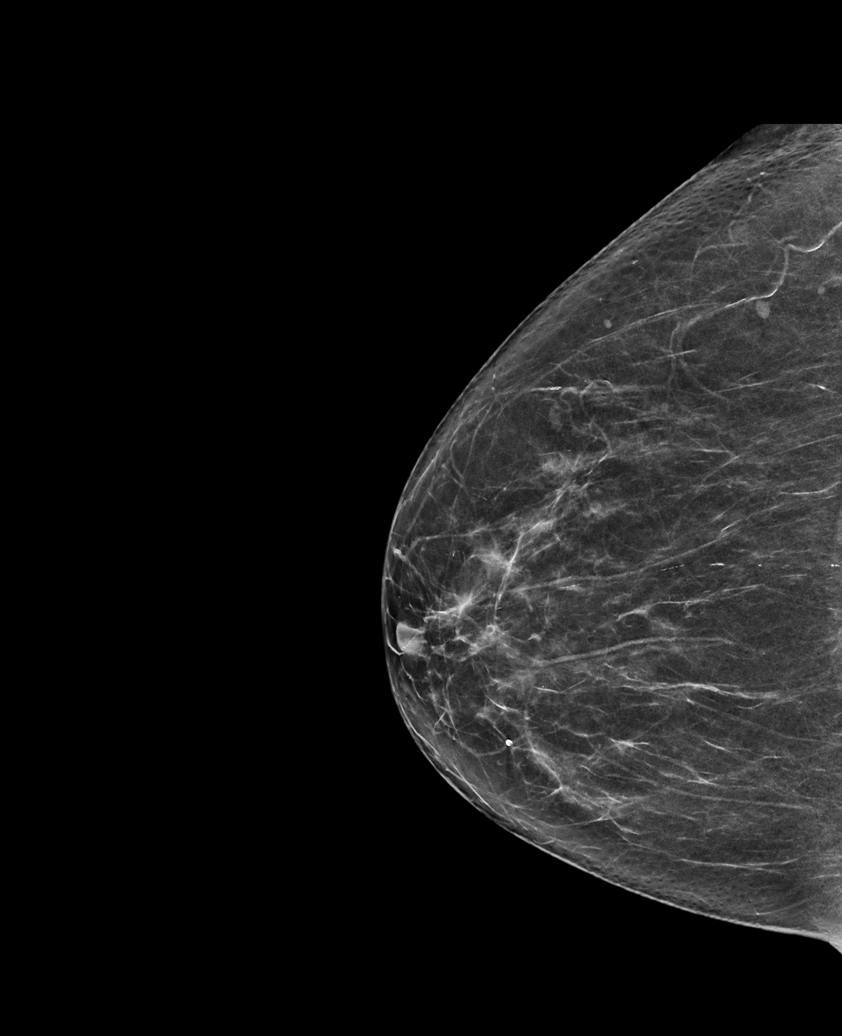

[L MLO synth-2D]
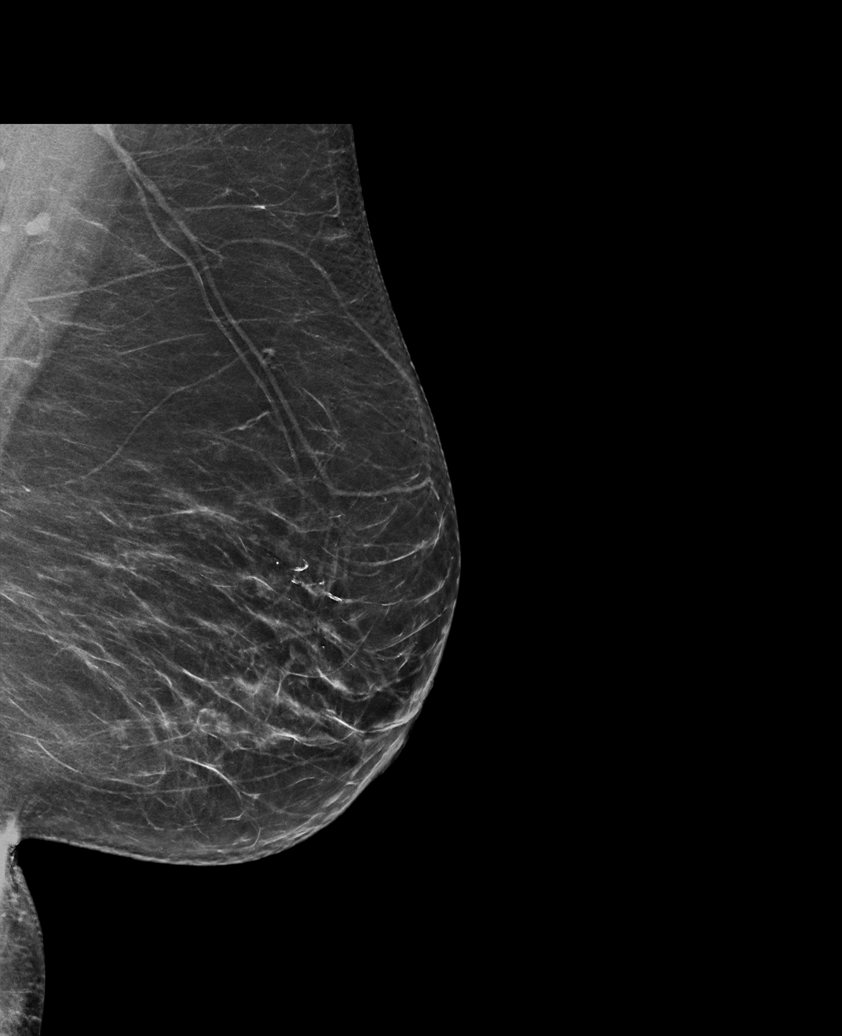

[R CC tomo · tomo slice 33/65.0]
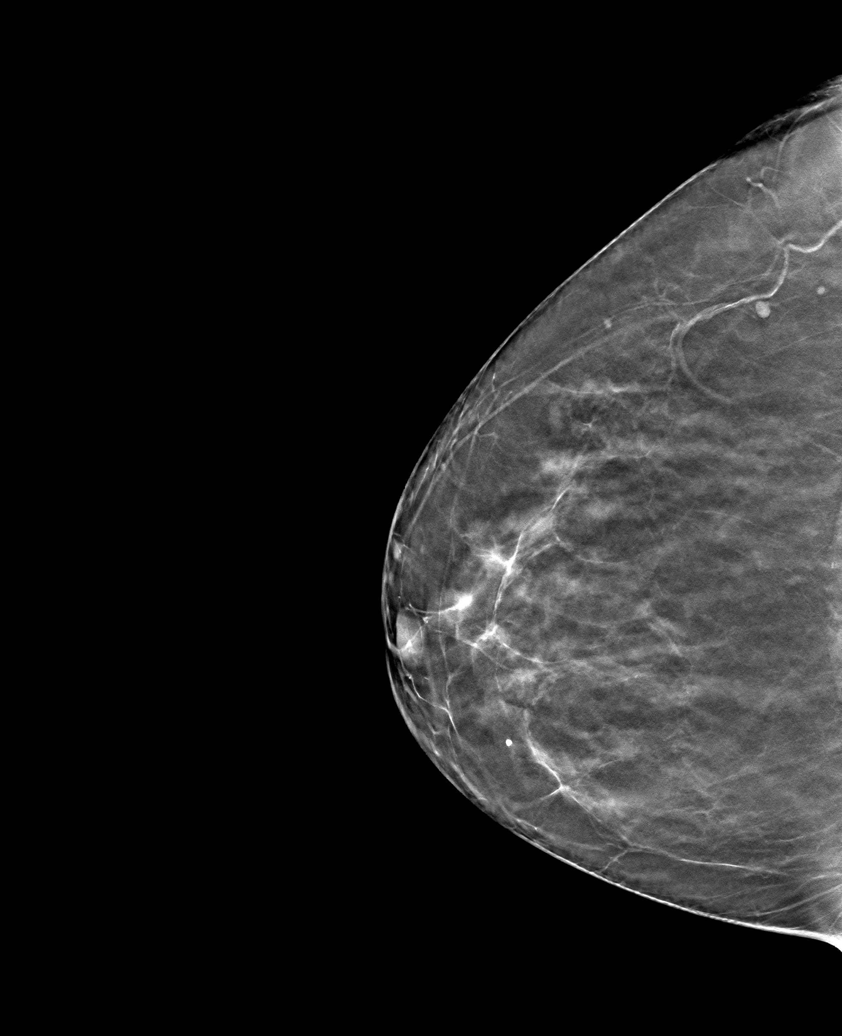

[6 of 30 positions shown; findings below may reference images not displayed]

ACR Breast Density Category b: There are scattered areas of
fibroglandular density.
FINDINGS: There are no findings suspicious for malignancy.
IMPRESSION: No mammographic evidence of malignancy. A result letter of this
screening mammogram will be mailed directly to the patient.

RECOMMENDATION:
Screening mammogram in one year. (Code:51-O-LD2)

BI-RADS CATEGORY  1: Negative.

## 2022-10-17 LAB — CUP PACEART REMOTE DEVICE CHECK
Battery Remaining Percentage: 90 %
Brady Statistic RA Percent Paced: 13 %
Brady Statistic RV Percent Paced: 93 %
Date Time Interrogation Session: 20240628131939
Implantable Lead Connection Status: 753985
Implantable Lead Connection Status: 753985
Implantable Lead Implant Date: 20230630
Implantable Lead Implant Date: 20230630
Implantable Lead Location: 753859
Implantable Lead Location: 753860
Implantable Lead Model: 377
Implantable Lead Model: 377
Implantable Lead Serial Number: 8000862625
Implantable Lead Serial Number: 800921449
Implantable Pulse Generator Implant Date: 20230630
Lead Channel Impedance Value: 527 Ohm
Lead Channel Impedance Value: 605 Ohm
Lead Channel Pacing Threshold Amplitude: 0.4 V
Lead Channel Pacing Threshold Amplitude: 1.1 V
Lead Channel Pacing Threshold Pulse Width: 0.4 ms
Lead Channel Pacing Threshold Pulse Width: 0.4 ms
Lead Channel Sensing Intrinsic Amplitude: 3.9 mV
Lead Channel Sensing Intrinsic Amplitude: 7.7 mV
Lead Channel Setting Pacing Amplitude: 2 V
Lead Channel Setting Pacing Amplitude: 2.4 V
Lead Channel Setting Pacing Pulse Width: 0.4 ms
Pulse Gen Model: 407145
Pulse Gen Serial Number: 70444647

## 2022-10-28 NOTE — Progress Notes (Signed)
Remote pacemaker transmission.   

## 2023-01-14 ENCOUNTER — Ambulatory Visit (INDEPENDENT_AMBULATORY_CARE_PROVIDER_SITE_OTHER): Payer: Medicare Other

## 2023-01-14 DIAGNOSIS — I441 Atrioventricular block, second degree: Secondary | ICD-10-CM | POA: Diagnosis not present

## 2023-01-15 LAB — CUP PACEART REMOTE DEVICE CHECK
Date Time Interrogation Session: 20240927093030
Implantable Lead Connection Status: 753985
Implantable Lead Connection Status: 753985
Implantable Lead Implant Date: 20230630
Implantable Lead Implant Date: 20230630
Implantable Lead Location: 753859
Implantable Lead Location: 753860
Implantable Lead Model: 377
Implantable Lead Model: 377
Implantable Lead Serial Number: 8000862625
Implantable Lead Serial Number: 800921449
Implantable Pulse Generator Implant Date: 20230630
Pulse Gen Model: 407145
Pulse Gen Serial Number: 70444647

## 2023-01-20 NOTE — Progress Notes (Signed)
Remote pacemaker transmission.   

## 2023-02-03 ENCOUNTER — Other Ambulatory Visit: Payer: Self-pay | Admitting: Obstetrics and Gynecology

## 2023-02-03 DIAGNOSIS — Z1231 Encounter for screening mammogram for malignant neoplasm of breast: Secondary | ICD-10-CM

## 2023-02-24 ENCOUNTER — Ambulatory Visit
Admission: RE | Admit: 2023-02-24 | Discharge: 2023-02-24 | Disposition: A | Discharge status: Home / Self Care | Payer: Medicare Other | Source: Ambulatory Visit | Attending: Obstetrics and Gynecology | Admitting: Obstetrics and Gynecology

## 2023-02-24 DIAGNOSIS — Z1231 Encounter for screening mammogram for malignant neoplasm of breast: Secondary | ICD-10-CM | POA: Diagnosis not present

## 2023-03-24 DIAGNOSIS — H2513 Age-related nuclear cataract, bilateral: Secondary | ICD-10-CM | POA: Diagnosis not present

## 2023-04-15 ENCOUNTER — Encounter: Payer: Self-pay | Admitting: Physician Assistant

## 2023-04-15 ENCOUNTER — Ambulatory Visit: Payer: Medicare Other | Attending: Cardiology | Admitting: Physician Assistant

## 2023-04-15 ENCOUNTER — Ambulatory Visit (INDEPENDENT_AMBULATORY_CARE_PROVIDER_SITE_OTHER): Payer: Medicare Other

## 2023-04-15 VITALS — BP 126/84 | HR 83 | Ht 60.0 in | Wt 157.2 lb

## 2023-04-15 DIAGNOSIS — I441 Atrioventricular block, second degree: Secondary | ICD-10-CM

## 2023-04-15 DIAGNOSIS — Z950 Presence of cardiac pacemaker: Secondary | ICD-10-CM | POA: Diagnosis not present

## 2023-04-15 DIAGNOSIS — I10 Essential (primary) hypertension: Secondary | ICD-10-CM

## 2023-04-15 LAB — CUP PACEART INCLINIC DEVICE CHECK
Date Time Interrogation Session: 20241227164733
Implantable Lead Connection Status: 753985
Implantable Lead Connection Status: 753985
Implantable Lead Implant Date: 20230630
Implantable Lead Implant Date: 20230630
Implantable Lead Location: 753859
Implantable Lead Location: 753860
Implantable Lead Model: 377
Implantable Lead Model: 377
Implantable Lead Serial Number: 8000862625
Implantable Lead Serial Number: 800921449
Implantable Pulse Generator Implant Date: 20230630
Lead Channel Pacing Threshold Amplitude: 0.5 V
Lead Channel Pacing Threshold Amplitude: 0.9 V
Lead Channel Pacing Threshold Pulse Width: 0.4 ms
Lead Channel Pacing Threshold Pulse Width: 0.4 ms
Lead Channel Sensing Intrinsic Amplitude: 4.6 mV
Pulse Gen Model: 407145
Pulse Gen Serial Number: 70444647

## 2023-04-15 NOTE — Patient Instructions (Signed)
Medication Instructions:    Your physician recommends that you continue on your current medications as directed. Please refer to the Current Medication list given to you today.  *If you need a refill on your cardiac medications before your next appointment, please call your pharmacy*   Lab Work: [place]   If you have labs (blood work) drawn today and your tests are completely normal, you will receive your results only by: [place] (if you have MyChart) OR A paper copy in the mail If you have any lab test that is abnormal or we need to change your treatment, we will call you to review the results.   Testing/Procedures: NONE ORDERED  TODAY    Follow-Up: At [facility], you and your health needs are our priority.  As part of our continuing mission to provide you with exceptional heart care, we have created designated Provider Care Teams.  These Care Teams include your primary Cardiologist (physician) and Advanced Practice Providers (APPs -  Physician Assistants and Nurse Practitioners) who all work together to provide you with the care you need, when you need it.  We recommend signing up for the patient portal called "MyChart".  Sign up information is provided on this After Visit Summary.  MyChart is used to connect with patients for Virtual Visits (Telemedicine).  Patients are able to view lab/test results, encounter notes, upcoming appointments, etc.  Non-urgent messages can be sent to your provider as well.   To learn more about what you can do with MyChart, go to [email or web address].    Your next appointment:   1 year(s)  Provider:   You may see Dr. Curt Bears  or one of the following Advanced Practice Providers on your designated Care Team:   Tommye Standard, [place]   Other Instructions

## 2023-04-15 NOTE — Progress Notes (Signed)
  Cardiology Office Note:  .   Date:  04/15/2023  ID:  Pamela Ponce, DOB [birth year], MRN [id number] PCP: Tally Joe, MD  [facility] Providers Cardiologist:  Donato Schultz, MD {  History of Present Illness: .   Pamela Ponce is a 67 y.o. female w/PMHx of HTN, HLD, advanced heart block w/PPM  Saw Dr. Elberta Fortis 02/17/22, doing well, no symptoms reported, device functioning normally, no changes were made   Today's visit is scheduled as an annual visit  ROS:   She comes today accompanied by her sister. She feels well Not exercising, but stays busy, no difficulties with her ADLs No CP, palpitations or cardiac awareness No SOB, DOE No near syncope or syncope  Sees her PMD, has labs done there   Device information Biotronik dual chamber PPM implanted 10/16/21    Studies Reviewed: Marland Kitchen    EKG done today and reviewed by myself:  SR/VP 83bpm  DEVICE interrogation done today and reviewed by myself Battery and lead measurements are good No arrhythmias No programming changes made   TTE 10/14/21   1. Left ventricular ejection fraction, by estimation, is 70 to 75%. The  left ventricle has hyperdynamic function. The left ventricle has no  regional wall motion abnormalities. Left ventricular diastolic parameters  were normal.   2. Right ventricular systolic function is normal. The right ventricular  size is normal.   3. The mitral valve is normal in structure. Mild mitral valve  regurgitation. No evidence of mitral stenosis.   4. The aortic valve is normal in structure. Aortic valve regurgitation is  not visualized. No aortic stenosis is present.   5. The inferior vena cava is normal in size with greater than 50%  respiratory variability, suggesting right atrial pressure of 3 mmHg.    Risk Assessment/Calculations:    Physical Exam:   VS:  There were no vitals taken for this visit.   Wt Readings from Last 3 Encounters:  02/17/22 154 lb (69.9 kg)  10/14/21 150 lb 5.7 oz  (68.2 kg)    GEN: Well nourished, well developed in no acute distress NECK: No JVD; No carotid bruits CARDIAC: RRR, no murmurs, rubs, gallops RESPIRATORY:  CTA b/l without rales, wheezing or rhonchi  ABDOMEN: Soft, non-tender, non-distended EXTREMITIES:  No edema; No deformity   PPM site: is stable, no thinning, fluctuation, tethering  ASSESSMENT AND PLAN: .    PPM Intact function No programming changes made  HTN Looks OK  Dispo: remotes as usual, back in clinic with EP in a year again, sooner if needed  Signed, Sheilah Pigeon, PA-C

## 2023-04-18 LAB — CUP PACEART REMOTE DEVICE CHECK
Date Time Interrogation Session: 20241227065744
Implantable Lead Connection Status: 753985
Implantable Lead Connection Status: 753985
Implantable Lead Implant Date: 20230630
Implantable Lead Implant Date: 20230630
Implantable Lead Location: 753859
Implantable Lead Location: 753860
Implantable Lead Model: 377
Implantable Lead Model: 377
Implantable Lead Serial Number: 8000862625
Implantable Lead Serial Number: 800921449
Implantable Pulse Generator Implant Date: 20230630
Pulse Gen Model: 407145
Pulse Gen Serial Number: 70444647

## 2023-04-29 DIAGNOSIS — E559 Vitamin D deficiency, unspecified: Secondary | ICD-10-CM | POA: Diagnosis not present

## 2023-04-29 DIAGNOSIS — E782 Mixed hyperlipidemia: Secondary | ICD-10-CM | POA: Diagnosis not present

## 2023-04-29 DIAGNOSIS — G4700 Insomnia, unspecified: Secondary | ICD-10-CM | POA: Diagnosis not present

## 2023-04-29 DIAGNOSIS — Z23 Encounter for immunization: Secondary | ICD-10-CM | POA: Diagnosis not present

## 2023-04-29 DIAGNOSIS — I10 Essential (primary) hypertension: Secondary | ICD-10-CM | POA: Diagnosis not present

## 2023-04-29 DIAGNOSIS — Z950 Presence of cardiac pacemaker: Secondary | ICD-10-CM | POA: Diagnosis not present

## 2023-04-29 DIAGNOSIS — Z0000 Encounter for general adult medical examination without abnormal findings: Secondary | ICD-10-CM | POA: Diagnosis not present

## 2023-04-29 DIAGNOSIS — Z1211 Encounter for screening for malignant neoplasm of colon: Secondary | ICD-10-CM | POA: Diagnosis not present

## 2023-04-29 DIAGNOSIS — M810 Age-related osteoporosis without current pathological fracture: Secondary | ICD-10-CM | POA: Diagnosis not present

## 2023-05-20 NOTE — Addendum Note (Signed)
Addended by: Elease Etienne A on: 05/20/2023 10:08 AM   Modules accepted: Orders

## 2023-05-20 NOTE — Progress Notes (Signed)
 Remote pacemaker transmission.

## 2023-07-15 ENCOUNTER — Ambulatory Visit (INDEPENDENT_AMBULATORY_CARE_PROVIDER_SITE_OTHER): Payer: Medicare Other

## 2023-07-15 DIAGNOSIS — I441 Atrioventricular block, second degree: Secondary | ICD-10-CM | POA: Diagnosis not present

## 2023-07-18 LAB — CUP PACEART REMOTE DEVICE CHECK
Battery Voltage: 85
Date Time Interrogation Session: 20250328085435
Implantable Lead Connection Status: 753985
Implantable Lead Connection Status: 753985
Implantable Lead Implant Date: 20230630
Implantable Lead Implant Date: 20230630
Implantable Lead Location: 753859
Implantable Lead Location: 753860
Implantable Lead Model: 377
Implantable Lead Model: 377
Implantable Lead Serial Number: 8000862625
Implantable Lead Serial Number: 800921449
Implantable Pulse Generator Implant Date: 20230630
Pulse Gen Model: 407145
Pulse Gen Serial Number: 70444647

## 2023-08-24 NOTE — Progress Notes (Signed)
 Remote pacemaker transmission.

## 2023-08-24 NOTE — Addendum Note (Signed)
 Addended by: Lott Rouleau A on: 08/24/2023 09:21 AM   Modules accepted: Orders

## 2023-10-14 ENCOUNTER — Ambulatory Visit (INDEPENDENT_AMBULATORY_CARE_PROVIDER_SITE_OTHER): Payer: Medicare Other

## 2023-10-14 DIAGNOSIS — I441 Atrioventricular block, second degree: Secondary | ICD-10-CM

## 2023-10-14 LAB — CUP PACEART REMOTE DEVICE CHECK
Battery Voltage: 85
Date Time Interrogation Session: 20250627092039
Implantable Lead Connection Status: 753985
Implantable Lead Connection Status: 753985
Implantable Lead Implant Date: 20230630
Implantable Lead Implant Date: 20230630
Implantable Lead Location: 753859
Implantable Lead Location: 753860
Implantable Lead Model: 377
Implantable Lead Model: 377
Implantable Lead Serial Number: 8000862625
Implantable Lead Serial Number: 800921449
Implantable Pulse Generator Implant Date: 20230630
Pulse Gen Model: 407145
Pulse Gen Serial Number: 70444647

## 2023-10-16 ENCOUNTER — Ambulatory Visit: Payer: Self-pay | Admitting: Cardiology

## 2023-10-24 DIAGNOSIS — R55 Syncope and collapse: Secondary | ICD-10-CM | POA: Diagnosis not present

## 2023-10-24 DIAGNOSIS — M810 Age-related osteoporosis without current pathological fracture: Secondary | ICD-10-CM | POA: Diagnosis not present

## 2023-10-24 DIAGNOSIS — E559 Vitamin D deficiency, unspecified: Secondary | ICD-10-CM | POA: Diagnosis not present

## 2023-10-24 DIAGNOSIS — G4700 Insomnia, unspecified: Secondary | ICD-10-CM | POA: Diagnosis not present

## 2023-10-24 DIAGNOSIS — Z87898 Personal history of other specified conditions: Secondary | ICD-10-CM | POA: Diagnosis not present

## 2023-10-24 DIAGNOSIS — I10 Essential (primary) hypertension: Secondary | ICD-10-CM | POA: Diagnosis not present

## 2023-10-24 DIAGNOSIS — E782 Mixed hyperlipidemia: Secondary | ICD-10-CM | POA: Diagnosis not present

## 2023-10-25 DIAGNOSIS — I10 Essential (primary) hypertension: Secondary | ICD-10-CM | POA: Diagnosis not present

## 2023-10-25 DIAGNOSIS — G4700 Insomnia, unspecified: Secondary | ICD-10-CM | POA: Diagnosis not present

## 2023-12-26 NOTE — Progress Notes (Signed)
 Remote pacemaker transmission.

## 2024-01-13 ENCOUNTER — Ambulatory Visit: Payer: Medicare Other

## 2024-01-13 DIAGNOSIS — I441 Atrioventricular block, second degree: Secondary | ICD-10-CM | POA: Diagnosis not present

## 2024-01-14 LAB — CUP PACEART REMOTE DEVICE CHECK
Date Time Interrogation Session: 20250926103105
Implantable Lead Connection Status: 753985
Implantable Lead Connection Status: 753985
Implantable Lead Implant Date: 20230630
Implantable Lead Implant Date: 20230630
Implantable Lead Location: 753859
Implantable Lead Location: 753860
Implantable Lead Model: 377
Implantable Lead Model: 377
Implantable Lead Serial Number: 8000862625
Implantable Lead Serial Number: 800921449
Implantable Pulse Generator Implant Date: 20230630
Pulse Gen Model: 407145
Pulse Gen Serial Number: 70444647

## 2024-01-16 ENCOUNTER — Ambulatory Visit: Payer: Self-pay | Admitting: Cardiology

## 2024-01-18 NOTE — Progress Notes (Signed)
 Remote PPM Transmission

## 2024-04-13 ENCOUNTER — Ambulatory Visit: Payer: Medicare Other

## 2024-04-13 DIAGNOSIS — I441 Atrioventricular block, second degree: Secondary | ICD-10-CM | POA: Diagnosis not present

## 2024-04-15 LAB — CUP PACEART REMOTE DEVICE CHECK
Date Time Interrogation Session: 20251226172530
Implantable Lead Connection Status: 753985
Implantable Lead Connection Status: 753985
Implantable Lead Implant Date: 20230630
Implantable Lead Implant Date: 20230630
Implantable Lead Location: 753859
Implantable Lead Location: 753860
Implantable Lead Model: 377
Implantable Lead Model: 377
Implantable Lead Serial Number: 8000862625
Implantable Lead Serial Number: 800921449
Implantable Pulse Generator Implant Date: 20230630
Pulse Gen Model: 407145
Pulse Gen Serial Number: 70444647

## 2024-04-16 ENCOUNTER — Ambulatory Visit: Payer: Self-pay | Admitting: Cardiology

## 2024-04-16 NOTE — Progress Notes (Signed)
 Remote PPM Transmission
